# Patient Record
Sex: Female | Born: 1975 | Hispanic: No | Marital: Married | Smoking: Never smoker
Health system: Southern US, Community
[De-identification: ages and names within clinical notes are randomized; demographics above are authoritative.]

## PROBLEM LIST (undated history)

## (undated) ENCOUNTER — Emergency Department (HOSPITAL_COMMUNITY): Admission: EM | Payer: Self-pay

## (undated) DIAGNOSIS — F419 Anxiety disorder, unspecified: Secondary | ICD-10-CM

## (undated) DIAGNOSIS — J40 Bronchitis, not specified as acute or chronic: Secondary | ICD-10-CM

## (undated) DIAGNOSIS — D649 Anemia, unspecified: Secondary | ICD-10-CM

## (undated) DIAGNOSIS — I639 Cerebral infarction, unspecified: Secondary | ICD-10-CM

## (undated) DIAGNOSIS — I671 Cerebral aneurysm, nonruptured: Secondary | ICD-10-CM

## (undated) DIAGNOSIS — I8390 Asymptomatic varicose veins of unspecified lower extremity: Secondary | ICD-10-CM

## (undated) HISTORY — PX: CEREBRAL ANEURYSM REPAIR: SHX164

## (undated) HISTORY — PX: CYST EXCISION: SHX5701

## (undated) HISTORY — DX: Cerebral aneurysm, nonruptured: I67.1

---

## 2004-06-04 ENCOUNTER — Ambulatory Visit (HOSPITAL_COMMUNITY): Admission: RE | Admit: 2004-06-04 | Discharge: 2004-06-04 | Payer: Self-pay | Admitting: Pulmonary Disease

## 2004-06-12 ENCOUNTER — Ambulatory Visit (HOSPITAL_COMMUNITY): Admission: RE | Admit: 2004-06-12 | Discharge: 2004-06-12 | Payer: Self-pay | Admitting: Pulmonary Disease

## 2004-09-12 ENCOUNTER — Ambulatory Visit (HOSPITAL_COMMUNITY): Admission: RE | Admit: 2004-09-12 | Discharge: 2004-09-12 | Payer: Self-pay | Admitting: Pulmonary Disease

## 2005-03-03 ENCOUNTER — Ambulatory Visit (HOSPITAL_COMMUNITY): Admission: RE | Admit: 2005-03-03 | Discharge: 2005-03-03 | Payer: Self-pay | Admitting: Pulmonary Disease

## 2005-11-07 ENCOUNTER — Emergency Department (HOSPITAL_COMMUNITY): Admission: EM | Admit: 2005-11-07 | Discharge: 2005-11-07 | Payer: Self-pay | Admitting: Emergency Medicine

## 2005-11-10 ENCOUNTER — Emergency Department (HOSPITAL_COMMUNITY): Admission: EM | Admit: 2005-11-10 | Discharge: 2005-11-11 | Payer: Self-pay | Admitting: Emergency Medicine

## 2006-04-12 ENCOUNTER — Inpatient Hospital Stay (HOSPITAL_COMMUNITY): Admission: AD | Admit: 2006-04-12 | Discharge: 2006-04-12 | Payer: Self-pay | Admitting: Obstetrics & Gynecology

## 2006-10-06 ENCOUNTER — Ambulatory Visit (HOSPITAL_COMMUNITY): Admission: RE | Admit: 2006-10-06 | Discharge: 2006-10-06 | Payer: Self-pay | Admitting: Obstetrics & Gynecology

## 2006-10-19 ENCOUNTER — Inpatient Hospital Stay (HOSPITAL_COMMUNITY): Admission: AD | Admit: 2006-10-19 | Discharge: 2006-10-28 | Payer: Self-pay | Admitting: Obstetrics & Gynecology

## 2006-10-20 ENCOUNTER — Encounter: Payer: Self-pay | Admitting: Obstetrics & Gynecology

## 2006-10-26 ENCOUNTER — Encounter (INDEPENDENT_AMBULATORY_CARE_PROVIDER_SITE_OTHER): Payer: Self-pay | Admitting: Obstetrics

## 2006-10-30 ENCOUNTER — Encounter: Admission: RE | Admit: 2006-10-30 | Discharge: 2006-11-29 | Payer: Self-pay | Admitting: Obstetrics & Gynecology

## 2006-11-30 ENCOUNTER — Encounter: Admission: RE | Admit: 2006-11-30 | Discharge: 2006-12-29 | Payer: Self-pay | Admitting: Obstetrics & Gynecology

## 2006-12-30 ENCOUNTER — Encounter: Admission: RE | Admit: 2006-12-30 | Discharge: 2007-01-18 | Payer: Self-pay | Admitting: Obstetrics & Gynecology

## 2007-03-21 ENCOUNTER — Emergency Department (HOSPITAL_COMMUNITY): Admission: EM | Admit: 2007-03-21 | Discharge: 2007-03-21 | Payer: Self-pay | Admitting: Emergency Medicine

## 2007-05-22 ENCOUNTER — Inpatient Hospital Stay (HOSPITAL_COMMUNITY): Admission: AD | Admit: 2007-05-22 | Discharge: 2007-05-22 | Payer: Self-pay | Admitting: Family Medicine

## 2007-05-27 ENCOUNTER — Encounter: Admission: RE | Admit: 2007-05-27 | Discharge: 2007-05-27 | Payer: Self-pay | Admitting: Obstetrics

## 2007-08-19 ENCOUNTER — Ambulatory Visit (HOSPITAL_COMMUNITY): Admission: RE | Admit: 2007-08-19 | Discharge: 2007-08-19 | Payer: Self-pay | Admitting: *Deleted

## 2007-08-23 ENCOUNTER — Inpatient Hospital Stay (HOSPITAL_COMMUNITY): Admission: AD | Admit: 2007-08-23 | Discharge: 2007-08-23 | Payer: Self-pay | Admitting: Family Medicine

## 2007-10-16 ENCOUNTER — Inpatient Hospital Stay (HOSPITAL_COMMUNITY): Admission: AD | Admit: 2007-10-16 | Discharge: 2007-10-16 | Payer: Self-pay | Admitting: Family Medicine

## 2007-11-26 ENCOUNTER — Ambulatory Visit: Payer: Self-pay | Admitting: Obstetrics and Gynecology

## 2007-11-26 ENCOUNTER — Inpatient Hospital Stay (HOSPITAL_COMMUNITY): Admission: AD | Admit: 2007-11-26 | Discharge: 2007-11-29 | Payer: Self-pay | Admitting: Obstetrics & Gynecology

## 2008-07-20 ENCOUNTER — Emergency Department (HOSPITAL_COMMUNITY): Admission: EM | Admit: 2008-07-20 | Discharge: 2008-07-20 | Payer: Self-pay | Admitting: Emergency Medicine

## 2009-11-15 ENCOUNTER — Inpatient Hospital Stay (HOSPITAL_COMMUNITY): Admission: AD | Admit: 2009-11-15 | Discharge: 2009-11-17 | Payer: Self-pay | Admitting: Family Medicine

## 2010-04-07 ENCOUNTER — Encounter: Payer: Self-pay | Admitting: Obstetrics & Gynecology

## 2010-04-07 ENCOUNTER — Encounter: Payer: Self-pay | Admitting: Emergency Medicine

## 2010-04-07 ENCOUNTER — Encounter: Payer: Self-pay | Admitting: Pulmonary Disease

## 2010-05-30 LAB — CBC
HCT: 31.5 % — ABNORMAL LOW (ref 36.0–46.0)
HCT: 36 % (ref 36.0–46.0)
Hemoglobin: 10.6 g/dL — ABNORMAL LOW (ref 12.0–15.0)
MCH: 31.5 pg (ref 26.0–34.0)
MCH: 32 pg (ref 26.0–34.0)
Platelets: 188 10*3/uL (ref 150–400)
RBC: 3.33 MIL/uL — ABNORMAL LOW (ref 3.87–5.11)
RDW: 13.4 % (ref 11.5–15.5)

## 2010-05-30 LAB — URINALYSIS, DIPSTICK ONLY
Bilirubin Urine: NEGATIVE
Glucose, UA: NEGATIVE mg/dL
Hgb urine dipstick: NEGATIVE
Nitrite: NEGATIVE

## 2010-05-30 LAB — COMPREHENSIVE METABOLIC PANEL
AST: 24 U/L (ref 0–37)
Albumin: 2.7 g/dL — ABNORMAL LOW (ref 3.5–5.2)
CO2: 20 mEq/L (ref 19–32)
GFR calc Af Amer: >60 mL/min (ref >60)
Glucose, Bld: 91 mg/dL (ref 70–99)
Potassium: 3.9 mEq/L (ref 3.5–5.1)
Sodium: 132 mEq/L — ABNORMAL LOW (ref 135–145)
Total Bilirubin: 0.2 mg/dL — ABNORMAL LOW (ref 0.3–1.2)

## 2010-05-30 LAB — RPR: RPR Ser Ql: NONREACTIVE

## 2010-05-30 LAB — LACTATE DEHYDROGENASE: LDH: 140 U/L (ref 94–250)

## 2010-06-01 ENCOUNTER — Encounter (INDEPENDENT_AMBULATORY_CARE_PROVIDER_SITE_OTHER): Payer: Self-pay | Admitting: *Deleted

## 2010-06-01 LAB — CONVERTED CEMR LAB
HCT: 35.5 % — ABNORMAL LOW (ref 36.0–46.0)
MCV: 88.3 fL (ref 78.0–100.0)
RBC: 4.02 M/uL (ref 3.87–5.11)
WBC: 5.2 10*3/uL (ref 4.0–10.5)

## 2010-07-30 NOTE — Discharge Summary (Signed)
Holly [name], Holly [name]                 ACCOUNT NO.:  [id number]   MEDICAL RECORD NO.:  [id number]          PATIENT TYPE:  INP   LOCATION:  9128                          FACILITY:  [facility]   PHYSICIAN:  Roseanna Rainbow, M.D.DATE OF BIRTH:  September 12, 1975   DATE OF ADMISSION:  10/19/2006  DATE OF DISCHARGE:  10/28/2006                               DISCHARGE SUMMARY   CHIEF COMPLAINT:  The patient is a 35 year old gravida 1 with an  estimated date of confinement of August 13 who presents for induction of  labor secondary to pregnancy-induced hypertension.   HISTORY OF PRESENT ILLNESS:  Please see the above.   PAST SURGICAL HISTORY:  Cyst removed from a lower extremity.   PAST MEDICAL HISTORY:  She denies.   MEDICATIONS:  Prenatal vitamins.   ALLERGIES:  No known drug allergies.   SOCIAL HISTORY:  She is married.  She denies any tobacco, ethanol or  drug use.   PHYSICAL EXAM:  VITAL SIGNS:  Stable, afebrile.  LUNGS:  Clear to  auscultation bilaterally.  HEART:  Regular rate and rhythm.  ABDOMEN:  Gravid, nontender.  PELVIC:  The cervix is 2-cm dilated, 50% effaced  with the vertex at a -3 station.   ASSESSMENT:  Intrauterine pregnancy at 38 weeks 5 days with pregnancy-  induced hypertension.   PLAN:  Admission, two-stage induction of labor.   HOSPITAL COURSE:  The patient was admitted.  Cervidil was placed;  subsequent to that, she went into labor.  The membranes were  artificially ruptured at 9 cm and thin meconium was noted.  She  progressed to full dilatation; however, there was no further descent  beyond a 0 station.  At this point, the decision was made to proceed  with a cesarean delivery.  Please see the dictated operative summary for  further details.  On postoperative day #1, her hemoglobin was 9.9.  Her  blood pressures were stable.  The magnesium sulfate that had been  initiated in the immediate postop period was continued.  On August 8,  there was a 500+ fluid balance  on the night shift.  The nurse  appreciated fine crackles in the lung bases.  The patient was saturating  at 98%.  A chest x-ray was obtained that was consistent with pulmonary  congestion.  Lasix was administered to expedite the diuresis.  Her blood  pressures became labile and she was started on labetalol and Norvasc.  On August 9, Cardiology was consulted.  The impression was pulmonary  edema in the setting of pregnancy-induced hypertension, rule out a  cardiomyopathy.  A chest CT was obtained; there was no pulmonary  embolus; bilateral pleural effusions were noted, possible early  pneumonia.  TSH was normal.  IV albumin was administered as well.  The  pulmonary edema improved.  The diuretics were continued orally.  An ACE  inhibitor was added to her antihypertensive regimen.  On August 11,  there was a question of whether or not she was developing an  endometritis; however, she remained afebrile.  The 2-D echo was normal  with good left ventricular  function noted.  Zithromax was started  empirically for possible upper respiratory tract infection.  She was  discharged to home on August 13.   DISCHARGE DIAGNOSES:  1. Arrest of descent.  2. Postpartum severe preeclampia, pulmonary edema.   PROCEDURE:  Cesarean delivery.   CONDITION:  Stable.   DIET:  Regular.   ACTIVITIES:  Progressive activity.   MEDICATIONS:  Lasix, lisinopril, clonidine, labetalol and Norvasc.   DISPOSITION:  The patient was to follow up in the office in several  days.  She is to follow up with Cardiology in 2 weeks.      Roseanna Rainbow, M.D.  Electronically Signed     LAJ/MEDQ  D:  11/20/2006  T:  11/21/2006  Job:  [id number]

## 2010-07-30 NOTE — Op Note (Signed)
[name], [name]                 ACCOUNT NO.:  [id number]   MEDICAL RECORD NO.:  [id number]          PATIENT TYPE:  INP   LOCATION:  9105                          FACILITY:  [facility]   PHYSICIAN:  Allie Bossier, MD        DATE OF BIRTH:  May 27, 1975   DATE OF PROCEDURE:  DATE OF DISCHARGE:                               OPERATIVE REPORT   PREOPERATIVE DIAGNOSES:  1. Previous cesarean section and trial of labor after cesarean      section.  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  3. Meconium.  4. The patient exhaustion.   POSTOPERATIVE DIAGNOSES:  1. Previous cesarean section and trial of labor after cesarean      section.  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  3. Meconium.  4. The patient exhaustion.   PROCEDURES:  Vaginal birth after cesarean section with vacuum  assistance, second-degree midline episiotomy, repair of fourth-degree  laceration.   OBSTETRICIAN:  Myra C. Marice Potter, MD   ANESTHESIA:  Lidocaine.   COMPLICATIONS:  None.   ESTIMATED BLOOD LOSS:  300 mL.   SPECIMEN:  Cord blood.   FINDINGS:  1. Living female infant, Apgars 8 and 9 at 1 and 5 minutes, weight 7      pounds 14 ounces.  2. Intact placenta with meconium-stained membranes.  3. Three-vessel cord.   DETAILED PROCEDURE AND FINDINGS:  The patient had no epidural and then  pushed for over 2 hours.  She was complaining of exhaustion and  requested vacuum assistance.  I explained the risks and she wishes to  proceed.  Her bladder was emptied.  The baby was noted to be in an OA  position at the +3 station.  I applied the Kiwi vacuum with the pressure  in the green zone for 2 pulls (with 2 correct contractions).  On the  second one, the Kiwi popped off, but the baby had made good descensus  about this point, so I decided to forego any further vacuum.  I gave her  lidocaine in her perineum and cut an episiotomy due to her female  circumcision.  The baby was delivered in an OA position.  His mouth and  nostrils were suctioned prior to  delivery of shoulders.  Cord was  clamped and cut.  Baby was transferred to the newborn personnel for  routine care.  Cord blood samples obtained.  I then inspected the  perineum and vagina.  A fourth-degree laceration was noted.  A 3-0  Vicryl suture was used throughout the repair.  The sphincter muscles  were identified and grasped with Allis clamps.  They were then  reapproximated using 4 separate interrupted sutures.  The vaginal mucosa  was closed with a normal running-locking fashion.  The perineum and skin  were closed in a normal running-nonlocking suture.  At the completion of  the case, it was a negative rectovaginal exam.  The placenta was then  delivered and noted to be intact with meconium-stained membranes.  She  had normal lochia post delivery and tolerated the procedure well.  She  was given a total of approximately 20  mL of lidocaine during this repair  along with 2 mg of IV Stadol.      Allie Bossier, MD  Electronically Signed     MCD/MEDQ  D:  11/26/2007  T:  11/27/2007  Job:  [id number]

## 2010-07-30 NOTE — Op Note (Signed)
Holly [name], Holly [name]                 ACCOUNT NO.:  [id number]   MEDICAL RECORD NO.:  [id number]          PATIENT TYPE:  INP   LOCATION:  9374                          FACILITY:  [facility]   PHYSICIAN:  Roseanna Rainbow, M.D.DATE OF BIRTH:  Jun 12, 1975   DATE OF PROCEDURE:  10/20/2006  DATE OF DISCHARGE:                               OPERATIVE REPORT   PREOPERATIVE DIAGNOSES:  1. Intrauterine pregnancy at term, arrest of descent, active labor.  2. Pregnancy-induced hypertension.  3. Meconium staining.  4. Rule out large-for-gestational-age fetus.   POSTOPERATIVE DIAGNOSES:  1. Intrauterine pregnancy at term, arrest of descent, active labor.  2. Pregnancy-induced hypertension.  3. Meconium staining.  4. Rule out large-for-gestational-age fetus.   PROCEDURE:  Primary low uterine flap elliptical cesarean delivery via  Pfannenstiel skin incision.   SURGEON:  Roseanna Rainbow, M.D.   ANESTHESIA:  Epidural.   PATHOLOGY:  Placenta.   ESTIMATED BLOOD LOSS:  600 mL.   COMPLICATIONS:  None.   PROCEDURE:  The patient was taken to the operating room with an IV  running, an epidural catheter in situ and a Foley in place.  The patient  was placed in the dorsal supine position with a lateral tilt and prepped  and draped in the usual sterile fashion.  After a time-out, a  Pfannenstiel skin incision was then made with a scalpel and carried down  to the underlying fascia.  The fascia was nicked in the midline.  The  fascial incision was then extended bilaterally with the curved Mayo  scissors.  The superior aspect of the fascial incision was tented up and  the underlying rectus muscle was dissected off.  The inferior aspect of  the fascial incision was manipulated in a similar fashion.  The rectus  muscles were separated in the midline.  The parietal peritoneum was  tented up and entered sharply; this incision was then extended  superiorly and inferiorly with good visualization of the  bladder.  An  Alexis retractor was then placed into the incision.  The vesicouterine  peritoneum was tented up and entered sharply with Metzenbaum scissors;  this incision was then extended bilaterally and a bladder flap created  bluntly.  The lower uterine segment was then incised in a transverse  fashion with the scalpel; this incision was then extended bluntly.  The  infant's head was then delivered atraumatically.  The oropharynx was  suctioned with the bulb suction.  The cord was clamped and cut.  The  infant was handed off to the awaiting neonatologist.  An umbilical  artery pH was sent.  The Apgars were 9 and 10 at 1 and 5 minutes,  respectively.  The placenta was then removed,  The intrauterine cavity  was evacuated of any remaining amniotic fluid, clots and debris with a  moist laparotomy sponge.  The uterine incision was then reapproximated  in a running interlocking fashion using a suture of 0 Monocryl.  A  second imbricating suture of the same was placed for an imbricating  layer.  There was bleeding noted from the venous plexus near the  dome of  the bladder; these vessels were secured with suture ligatures of 2-0  Vicryl.  Adequate hemostasis was noted.  The paracolic gutters were then  copiously irrigated.  The retractor was then removed.  The parietal  peritoneum was reapproximated in a running fashion using 2-0 Vicryl.  The fascia was closed in a running fashion using 0 Vicryl.  The skin was  closed in a subcuticular fashion using 3-0 Monocryl.  Steri-Strips were  then applied.  At the close of the procedure, The instrument and pack  counts were said to be correct x2.  One gram of cephazolin had been  given at cord clamp.  The patient was taken to the PACU, awake and in  stable condition.      Roseanna Rainbow, M.D.  Electronically Signed     LAJ/MEDQ  D:  10/20/2006  T:  10/21/2006  Job:  [id number]

## 2010-12-05 LAB — POCT CARDIAC MARKERS
CKMB, poc: <1 — ABNORMAL LOW
Myoglobin, poc: 46.3

## 2010-12-05 LAB — HCG, QUANTITATIVE, PREGNANCY: hCG, Beta Chain, Quant, S: 205 — ABNORMAL HIGH

## 2010-12-05 LAB — I-STAT 8, (EC8 V) (CONVERTED LAB)
Bicarbonate: 25.4 — ABNORMAL HIGH
Glucose, Bld: 98
TCO2: 27
pCO2, Ven: 48.4
pH, Ven: 7.328 — ABNORMAL HIGH

## 2010-12-05 LAB — POCT PREGNANCY, URINE: Operator id: 116391

## 2010-12-09 LAB — URINALYSIS, ROUTINE W REFLEX MICROSCOPIC
Glucose, UA: NEGATIVE
Leukocytes, UA: NEGATIVE
Protein, ur: NEGATIVE
pH: 7.5

## 2010-12-09 LAB — WET PREP, GENITAL: Clue Cells Wet Prep HPF POC: NONE SEEN

## 2010-12-09 LAB — URINE MICROSCOPIC-ADD ON

## 2010-12-09 LAB — GC/CHLAMYDIA PROBE AMP, GENITAL: GC Probe Amp, Genital: NEGATIVE

## 2010-12-12 LAB — URINALYSIS, ROUTINE W REFLEX MICROSCOPIC
Hgb urine dipstick: NEGATIVE
Specific Gravity, Urine: 1.025
Urobilinogen, UA: 0.2

## 2010-12-18 LAB — COMPREHENSIVE METABOLIC PANEL
ALT: 12
AST: 27
Albumin: 2.7 — ABNORMAL LOW
Alkaline Phosphatase: 229 — ABNORMAL HIGH
Chloride: 103
GFR calc Af Amer: >60
Potassium: 4.7
Sodium: 131 — ABNORMAL LOW
Total Bilirubin: 0.8

## 2010-12-18 LAB — CBC
MCV: 92.9
RBC: 4.27
WBC: 5.9

## 2010-12-18 LAB — RPR: RPR Ser Ql: NONREACTIVE

## 2010-12-30 LAB — CBC
HCT: 28.2 — ABNORMAL LOW
HCT: 28.7 — ABNORMAL LOW
HCT: 34.5 — ABNORMAL LOW
HCT: 36.6
Hemoglobin: 12.2
Hemoglobin: 9.9 — ABNORMAL LOW
MCHC: 33.9
MCHC: 34.4
MCV: 88.3
MCV: 89
MCV: 89.1
Platelets: 221
Platelets: 233
Platelets: 291
RBC: 3.17 — ABNORMAL LOW
RBC: 3.19 — ABNORMAL LOW
RBC: 3.24 — ABNORMAL LOW
RDW: 13.7
RDW: 13.8
RDW: 14.1 — ABNORMAL HIGH
WBC: 11 — ABNORMAL HIGH
WBC: 8.6
WBC: 8.9
WBC: 9.5

## 2010-12-30 LAB — BASIC METABOLIC PANEL
BUN: 10
BUN: 7
CO2: 26
Calcium: 8.3 — ABNORMAL LOW
Chloride: 104
Creatinine, Ser: 0.65
Creatinine, Ser: 0.69
GFR calc Af Amer: >60
GFR calc non Af Amer: >60
Glucose, Bld: 116 — ABNORMAL HIGH
Potassium: 3.8
Sodium: 139

## 2010-12-30 LAB — CK TOTAL AND CKMB (NOT AT ARMC)
CK, MB: 6.9 — ABNORMAL HIGH
Relative Index: INVALID
Total CK: 122
Total CK: 88

## 2010-12-30 LAB — COMPREHENSIVE METABOLIC PANEL
ALT: 11
AST: 27
Albumin: 2.8 — ABNORMAL LOW
BUN: 5 — ABNORMAL LOW
BUN: 9
Calcium: 7.7 — ABNORMAL LOW
Calcium: 9.2
Creatinine, Ser: 0.58
GFR calc Af Amer: >60
GFR calc non Af Amer: >60
Glucose, Bld: 76
Sodium: 136
Total Protein: 5.1 — ABNORMAL LOW

## 2010-12-30 LAB — IRON AND TIBC
Iron: 86
UIBC: 255

## 2010-12-30 LAB — RPR: RPR Ser Ql: NONREACTIVE

## 2010-12-30 LAB — TROPONIN I
Troponin I: 0.12 — ABNORMAL HIGH
Troponin I: 0.21 — ABNORMAL HIGH

## 2010-12-30 LAB — T4, FREE: Free T4: 0.99

## 2010-12-30 LAB — TSH: TSH: 2.36

## 2010-12-30 LAB — URIC ACID: Uric Acid, Serum: 5.2

## 2010-12-30 LAB — B-NATRIURETIC PEPTIDE (CONVERTED LAB): Pro B Natriuretic peptide (BNP): 365 — ABNORMAL HIGH

## 2013-12-24 ENCOUNTER — Encounter (HOSPITAL_COMMUNITY): Payer: Self-pay | Admitting: Emergency Medicine

## 2013-12-24 ENCOUNTER — Emergency Department (HOSPITAL_COMMUNITY): Payer: Self-pay

## 2013-12-24 ENCOUNTER — Emergency Department (HOSPITAL_COMMUNITY)
Admission: EM | Admit: 2013-12-24 | Discharge: 2013-12-24 | Disposition: A | Discharge status: Home / Self Care | Payer: Self-pay | Attending: Emergency Medicine | Admitting: Emergency Medicine

## 2013-12-24 DIAGNOSIS — R079 Chest pain, unspecified: Secondary | ICD-10-CM | POA: Insufficient documentation

## 2013-12-24 DIAGNOSIS — Z79899 Other long term (current) drug therapy: Secondary | ICD-10-CM | POA: Insufficient documentation

## 2013-12-24 LAB — CBC
HCT: 31.6 % — ABNORMAL LOW (ref 36.0–46.0)
Hemoglobin: 10 g/dL — ABNORMAL LOW (ref 12.0–15.0)
MCH: 24.8 pg — ABNORMAL LOW (ref 26.0–34.0)
MCHC: 31.6 g/dL (ref 30.0–36.0)
MCV: 78.4 fL (ref 78.0–100.0)
PLATELETS: 280 10*3/uL (ref 150–400)
RBC: 4.03 MIL/uL (ref 3.87–5.11)
RDW: 15.3 % (ref 11.5–15.5)
WBC: 3.4 10*3/uL — AB (ref 4.0–10.5)

## 2013-12-24 LAB — I-STAT TROPONIN, ED: Troponin i, poc: 0 ng/mL (ref 0.00–0.08)

## 2013-12-24 LAB — PRO B NATRIURETIC PEPTIDE: Pro B Natriuretic peptide (BNP): 41.4 pg/mL (ref 0–125)

## 2013-12-24 LAB — BASIC METABOLIC PANEL
ANION GAP: 13 (ref 5–15)
BUN: 11 mg/dL (ref 6–23)
CHLORIDE: 101 meq/L (ref 96–112)
CO2: 23 meq/L (ref 19–32)
Calcium: 9.2 mg/dL (ref 8.4–10.5)
Creatinine, Ser: 0.59 mg/dL (ref 0.50–1.10)
GFR calc non Af Amer: >90 mL/min (ref >90)
Glucose, Bld: 86 mg/dL (ref 70–99)
POTASSIUM: 4 meq/L (ref 3.7–5.3)
Sodium: 137 mEq/L (ref 137–147)

## 2013-12-24 NOTE — Discharge Instructions (Signed)

## 2013-12-24 NOTE — ED Provider Notes (Signed)
CSN: [id number]     Arrival date & time 12/24/13  1150 History   First MD Initiated Contact with Patient 12/24/13 1441     Chief Complaint  Patient presents with  . Chest Pain   Patient is a 38 y.o. female presenting with chest pain. The history is provided by the patient.  Chest Pain Pain location:  Substernal area Pain radiates to:  L arm Pain radiates to the back: yes   Duration:  4 days Timing:  Intermittent Progression:  Waxing and waning Chronicity:  Recurrent Worsened by:  Nothing tried Ineffective treatments:  None tried Associated symptoms: no abdominal pain, no back pain, no diaphoresis, no dizziness, no fatigue, no fever, no headache, no lower extremity edema, no nausea, no palpitations, no shortness of breath and not vomiting    38 year old African female with no significant past history presents with chest pain. Onset was 4 days ago and is intermittent. There are no aggravating or alleviating factors. The patient denies shortness of breath. She states the pain occasionally radiates to her back and into her left arm. Patient is currently chest pain-free. Patient denies history of hypertension, diabetes, hyperlipidemia and, tobacco abuse or family history of cardiac disease.  History reviewed. No pertinent past medical history. History reviewed. No pertinent past surgical history. History reviewed. No pertinent family history. History  Substance Use Topics  . Smoking status: Never Smoker   . Smokeless tobacco: Not on file  . Alcohol Use: No   OB History   Grav Para Term Preterm Abortions TAB SAB Ect Mult Living                 Review of Systems  Constitutional: Negative for fever, diaphoresis and fatigue.  HENT: Negative for rhinorrhea and sore throat.   Eyes: Negative for visual disturbance.  Respiratory: Negative for chest tightness and shortness of breath.   Cardiovascular: Positive for chest pain. Negative for palpitations.  Gastrointestinal: Negative for  nausea, vomiting, abdominal pain and constipation.  Genitourinary: Negative for dysuria and hematuria.  Musculoskeletal: Negative for back pain and neck pain.  Skin: Negative for rash.  Neurological: Negative for dizziness and headaches.  Psychiatric/Behavioral: Negative for confusion.  All other systems reviewed and are negative.  Allergies  Mango flavor  Home Medications   Prior to Admission medications   Medication Sig Start Date End Date Taking? Authorizing Provider  acetaminophen (TYLENOL) 325 MG tablet Take 650 mg by mouth 2 (two) times daily as needed (pain).   Yes Historical Provider, MD  Iron TABS Take 1 tablet by mouth daily.   Yes Historical Provider, MD  Polyethyl Glycol-Propyl Glycol (SYSTANE OP) Place 1 drop into both eyes 2 (two) times daily as needed (dry eyes).   Yes Historical Provider, MD   BP 116/78  Pulse 72  Temp(Src) 98.6 F (37 C) (Oral)  Resp 18  Ht 5\' 8"  (1.727 m)  Wt 170 lb (77.111 kg)  BMI 25.85 kg/m2  SpO2 99%  LMP 12/22/2013 Physical Exam  Constitutional: She is oriented to person, place, and time. She appears well-developed and well-nourished. No distress.  HENT:  Head: Normocephalic and atraumatic.  Mouth/Throat: Oropharynx is clear and moist.  Eyes: EOM are normal. Pupils are equal, round, and reactive to light.  Neck: Neck supple. No JVD present.  Cardiovascular: Normal rate, regular rhythm, normal heart sounds and intact distal pulses.  Exam reveals no gallop.   No murmur heard. Pulmonary/Chest: Effort normal and breath sounds normal. She has no wheezes. She  has no rales.  Abdominal: Soft. She exhibits no distension. There is no tenderness.  Musculoskeletal: Normal range of motion. She exhibits no tenderness.  Neurological: She is alert and oriented to person, place, and time. No cranial nerve deficit. She exhibits normal muscle tone.  Skin: Skin is warm and dry. No rash noted.  Psychiatric: Her behavior is normal.    ED Course   Procedures  None  Labs Review Labs Reviewed  CBC - Abnormal; Notable for the following:    WBC 3.4 (*)    Hemoglobin 10.0 (*)    HCT 31.6 (*)    MCH 24.8 (*)    All other components within normal limits  BASIC METABOLIC PANEL  PRO B NATRIURETIC PEPTIDE  I-STAT TROPOININ, ED    Imaging Review Dg Chest 2 View  12/24/2013   CLINICAL DATA:  Chest pain  EXAM: CHEST  2 VIEW  COMPARISON:  10/24/2006  FINDINGS: Cardiomediastinal silhouette is stable. Mild thoracic dextroscoliosis. No acute infiltrate or pleural effusion. No pulmonary edema.  IMPRESSION: No active cardiopulmonary disease.  Mild thoracic dextroscoliosis.   Electronically Signed   By: Lahoma Crocker M.D.   On: 12/24/2013 13:43     EKG Interpretation   Date/Time:  Saturday December 24 2013 11:57:10 EDT Ventricular Rate:  77 PR Interval:  176 QRS Duration: 82 QT Interval:  354 QTC Calculation: 400 R Axis:   45 Text Interpretation:  Normal sinus rhythm Possible Left atrial enlargement  Borderline ECG Confirmed by BEATON  MD, ROBERT ([id number]) on 12/24/2013  4:12:21 PM      MDM   Final diagnoses:  Chest pain, unspecified chest pain type    38 year old African female presents with chest pain. She is well appearing on exam. Afebrile, VSS. Troponin negative. EKG are reassuring. Chest x-ray unremarkable. Remainder of labs unremarkable. EKG demonstrates NSR, rate 77, TWI in aVR and V1, QTc 400, no ST elevation or depression, no ectopy. No comparison EKG. Patient desires to have outpatient stress test as opposed to coming in and observation. Feel this is reasonable. Cardiology consult. Spoke with Dr. Doylene Canard who agreed to perform outpatient stress test in 2 days. Patient stable for discharge.  Case discussed with Dr. Audie Pinto.   Gustavus Bryant, MD 12/24/13 [phone]

## 2013-12-24 NOTE — ED Provider Notes (Signed)
I saw and evaluated the patient, reviewed the resident's note and I agree with the findings and plan.   .Face to face Exam:  General:  Awake HEENT:  Atraumatic Resp:  Normal effort Abd:  Nondistended Neuro:No focal weakness Lymph: No adenopathy  EKG was discussed and reviewed with resident  Dot Lanes, MD 12/24/13 2125

## 2013-12-24 NOTE — ED Notes (Signed)
Pt reports that she has had midsternal chest pain for the past 4 days. Reports some SOB, but no n/v with the pain. Tried tylenol at home with no relief.

## 2014-06-04 ENCOUNTER — Inpatient Hospital Stay (HOSPITAL_COMMUNITY)
Admission: AD | Admit: 2014-06-04 | Discharge: 2014-06-05 | Disposition: A | Discharge status: Home / Self Care | Payer: Self-pay | Source: Ambulatory Visit | Attending: Obstetrics and Gynecology | Admitting: Obstetrics and Gynecology

## 2014-06-04 DIAGNOSIS — R102 Pelvic and perineal pain: Secondary | ICD-10-CM | POA: Insufficient documentation

## 2014-06-04 DIAGNOSIS — N898 Other specified noninflammatory disorders of vagina: Secondary | ICD-10-CM | POA: Insufficient documentation

## 2014-06-04 LAB — POCT PREGNANCY, URINE: PREG TEST UR: NEGATIVE

## 2014-06-04 NOTE — MAU Note (Signed)
Lower abdominal pain x 4 days, constant. Denies vaginal bleeding. Vaginal discharge, white& thick; x 4 days; denies vaginal irritation. LMP 05/19/14

## 2014-06-05 ENCOUNTER — Encounter (HOSPITAL_COMMUNITY): Payer: Self-pay | Admitting: *Deleted

## 2014-06-05 DIAGNOSIS — N949 Unspecified condition associated with female genital organs and menstrual cycle: Secondary | ICD-10-CM

## 2014-06-05 LAB — URINALYSIS, ROUTINE W REFLEX MICROSCOPIC
BILIRUBIN URINE: NEGATIVE
Glucose, UA: NEGATIVE mg/dL
Ketones, ur: 15 mg/dL — AB
Leukocytes, UA: NEGATIVE
NITRITE: NEGATIVE
PH: 6 (ref 5.0–8.0)
Protein, ur: NEGATIVE mg/dL
SPECIFIC GRAVITY, URINE: 1.025 (ref 1.005–1.030)
UROBILINOGEN UA: 0.2 mg/dL (ref 0.0–1.0)

## 2014-06-05 LAB — WET PREP, GENITAL
Clue Cells Wet Prep HPF POC: NONE SEEN
Trich, Wet Prep: NONE SEEN
Yeast Wet Prep HPF POC: NONE SEEN

## 2014-06-05 LAB — URINE MICROSCOPIC-ADD ON

## 2014-06-05 LAB — GC/CHLAMYDIA PROBE AMP (~~LOC~~) NOT AT ARMC
Chlamydia: NEGATIVE
Neisseria Gonorrhea: NEGATIVE

## 2014-06-05 LAB — HIV ANTIBODY (ROUTINE TESTING W REFLEX): HIV Screen 4th Generation wRfx: NONREACTIVE

## 2014-06-05 MED ORDER — IBUPROFEN 600 MG PO TABS: 600.0000 mg | ORAL_TABLET | Freq: Four times a day (QID) | ORAL | Status: DC | PRN

## 2014-06-05 MED ORDER — IBUPROFEN 600 MG PO TABS
600.0000 mg | ORAL_TABLET | Freq: Once | ORAL | Status: AC
  Administered 2014-06-05: 600 mg via ORAL
  Filled 2014-06-05: qty 1

## 2014-06-05 NOTE — MAU Provider Note (Signed)
History     CSN: [id number]  Arrival date and time: 06/04/14 2247   First Provider Initiated Contact with Patient 06/05/14 0134      Chief Complaint  Patient presents with  . Abdominal Pain   HPI  [name] is a 39 y.o. [id number] here with report of lower bilateral abdominal pain x 4 days.  Pain is described as constant and often radiates to back. Pt states that she is also having vaginal discharge that is clear with some pinksih spotting yesterday. No report of vaginal itching or odor.  Pt denies nausea vomiting and diarrhea.  Patient's last menstrual period was 05/19/2014.  No current form of birth control.  History reviewed. No pertinent past medical history.  Past Surgical History  Procedure Laterality Date  . Cyst excision    . Cesarean section      History reviewed. No pertinent family history.  History  Substance Use Topics  . Smoking status: Never Smoker   . Smokeless tobacco: Not on file  . Alcohol Use: No    Allergies:  Allergies  Allergen Reactions  . Mango Flavor Itching    Prescriptions prior to admission  Medication Sig Dispense Refill Last Dose  . acetaminophen (TYLENOL) 325 MG tablet Take 650 mg by mouth 2 (two) times daily as needed (pain).   12/22/2013 at Unknown time  . Iron TABS Take 1 tablet by mouth daily.   12/23/2013 at Unknown time  . Polyethyl Glycol-Propyl Glycol (SYSTANE OP) Place 1 drop into both eyes 2 (two) times daily as needed (dry eyes).   12/23/2013 at Unknown time    Review of Systems  Constitutional: Negative for fever and chills.  Gastrointestinal: Positive for abdominal pain. Negative for nausea, vomiting, diarrhea and constipation.  Genitourinary: Negative for dysuria, urgency and frequency.       Pink vaginal discharge  Musculoskeletal: Positive for back pain.  Neurological: Negative for dizziness and headaches.  All other systems reviewed and are negative.  Physical Exam   Blood pressure 138/84, pulse 76,  temperature 99.4 F (37.4 C), temperature source Oral, resp. rate 16, height 5\' 5"  (1.651 m), weight 81.92 kg (180 lb 9.6 oz), last menstrual period 05/19/2014, SpO2 100 %.  Physical Exam  Constitutional: She is oriented to person, place, and time. She appears well-developed and well-nourished. No distress.  HENT:  Head: Normocephalic.  Neck: Normal range of motion. Neck supple.  Cardiovascular: Normal rate, regular rhythm and normal heart sounds.   Respiratory: Effort normal and breath sounds normal. No respiratory distress.  GI: Soft. There is no tenderness.  Genitourinary: Cervix exhibits discharge (clear egg-white appearing discharge). Right adnexum displays no mass and no tenderness. Left adnexum displays no mass and no tenderness. No bleeding in the vagina. No vaginal discharge found.  Musculoskeletal: Normal range of motion. She exhibits no edema.  Neurological: She is alert and oriented to person, place, and time. She has normal reflexes.  Skin: Skin is warm and dry.    MAU Course  Procedures Results for orders placed or performed during the hospital encounter of 06/04/14 (from the past 24 hour(s))  Urinalysis, Routine w reflex microscopic     Status: Abnormal   Collection Time: 06/04/14 11:28 PM  Result Value Ref Range   Color, Urine YELLOW YELLOW   APPearance CLEAR CLEAR   Specific Gravity, Urine 1.025 1.005 - 1.030   pH 6.0 5.0 - 8.0   Glucose, UA NEGATIVE NEGATIVE mg/dL   Hgb urine dipstick SMALL (A)  NEGATIVE   Bilirubin Urine NEGATIVE NEGATIVE   Ketones, ur 15 (A) NEGATIVE mg/dL   Protein, ur NEGATIVE NEGATIVE mg/dL   Urobilinogen, UA 0.2 0.0 - 1.0 mg/dL   Nitrite NEGATIVE NEGATIVE   Leukocytes, UA NEGATIVE NEGATIVE  Urine microscopic-add on     Status: Abnormal   Collection Time: 06/04/14 11:28 PM  Result Value Ref Range   Squamous Epithelial / LPF RARE RARE   WBC, UA 0-2 <3 WBC/hpf   RBC / HPF 3-6 <3 RBC/hpf   Bacteria, UA FEW (A) RARE  Pregnancy, urine POC      Status: None   Collection Time: 06/04/14 11:41 PM  Result Value Ref Range   Preg Test, Ur NEGATIVE NEGATIVE  Wet prep, genital     Status: Abnormal   Collection Time: 06/05/14  2:15 AM  Result Value Ref Range   Yeast Wet Prep HPF POC NONE SEEN NONE SEEN   Trich, Wet Prep NONE SEEN NONE SEEN   Clue Cells Wet Prep HPF POC NONE SEEN NONE SEEN   WBC, Wet Prep HPF POC FEW (A) NONE SEEN   Ibuprofen 600 mg PO  Assessment and Plan  Acute lower pelvic pain   Plan: GC/CT and HIV pending Outpatient ultrasound per patient request Discussed the possibility of ovulation related pain  Holly Gray, CNM

## 2014-06-05 NOTE — MAU Note (Signed)
Pt. having lower abdominal pain x 4 days that is not going away. Pt states that pain is constant and often radiates to back. Pt states that she is also having discharge that is clear with some pinksih spotting yesterday. Pt denies nausea vomiting and diarrhea.

## 2014-06-07 ENCOUNTER — Encounter (HOSPITAL_COMMUNITY): Payer: Self-pay | Admitting: *Deleted

## 2014-06-07 ENCOUNTER — Inpatient Hospital Stay (HOSPITAL_COMMUNITY)
Admission: AD | Admit: 2014-06-07 | Discharge: 2014-06-07 | Disposition: A | Discharge status: Home / Self Care | Payer: Self-pay | Source: Ambulatory Visit | Attending: Obstetrics & Gynecology | Admitting: Obstetrics & Gynecology

## 2014-06-07 ENCOUNTER — Ambulatory Visit (HOSPITAL_COMMUNITY)
Admission: RE | Admit: 2014-06-07 | Discharge: 2014-06-07 | Disposition: A | Discharge status: Home / Self Care | Payer: Self-pay | Source: Ambulatory Visit | Attending: Family | Admitting: Family

## 2014-06-07 DIAGNOSIS — D251 Intramural leiomyoma of uterus: Secondary | ICD-10-CM

## 2014-06-07 DIAGNOSIS — N83201 Unspecified ovarian cyst, right side: Secondary | ICD-10-CM

## 2014-06-07 DIAGNOSIS — R102 Pelvic and perineal pain: Secondary | ICD-10-CM

## 2014-06-07 DIAGNOSIS — R51 Headache: Secondary | ICD-10-CM | POA: Insufficient documentation

## 2014-06-07 DIAGNOSIS — R1031 Right lower quadrant pain: Secondary | ICD-10-CM

## 2014-06-07 DIAGNOSIS — N8320 Unspecified ovarian cysts: Secondary | ICD-10-CM | POA: Insufficient documentation

## 2014-06-07 DIAGNOSIS — D259 Leiomyoma of uterus, unspecified: Secondary | ICD-10-CM | POA: Insufficient documentation

## 2014-06-07 DIAGNOSIS — R112 Nausea with vomiting, unspecified: Secondary | ICD-10-CM | POA: Insufficient documentation

## 2014-06-07 MED ORDER — ACETAMINOPHEN-CODEINE #3 300-30 MG PO TABS: 1.0000 | ORAL_TABLET | Freq: Four times a day (QID) | ORAL | Status: DC | PRN

## 2014-06-07 NOTE — MAU Note (Signed)
Urine in lab 

## 2014-06-07 NOTE — MAU Note (Signed)
Pt has outpt [place] scheduled at 1315.  Called [place], as pt needs [place].  Pt taken to [place], they will bring her back when she is done.

## 2014-06-07 NOTE — MAU Note (Signed)
Was here last Sun night, had pain in pelvis.  Tests were neg.  Pain has continued, gotten worse.  Last night had a fever. (100.4) no appetite. No vomiting, no diarrhea.  Constant headache.

## 2014-06-07 NOTE — MAU Provider Note (Signed)
History     CSN: [id number]  Arrival date and time: 06/07/14 1240   First Provider Initiated Contact with Patient 06/07/14 1627      No chief complaint on file.  HPI  Holly Gray is a 39 y.o. [id number], not currently pregnant with no significant medical history presenting with lower pelvic pain for the past 3 years. The pain is dull and constant but can be sharp and 10/10 pain at times. Current pain is 1/10. Pt says pain often radiates to her back. She takes ibuprofen that temporarily relieves the pain. She notes some light pink vaginal spotting that has occurred for the past few days. She also had some brown and clear discharge 3 days ago. She reports a fever of 104 yesterday that was relieved after taking a nap and waking up diaphoretic. She has been feeling nauseous for the past 3 days and has vomited twice with last episode this morning. She has also had a mild, 3/10, temporal headache since yesterday. Denies photophobia or phonophobia. Pain relieved with ibuprofen. She reports feeling "fine" now. LMP 05/19/14. Menstruation has been occuring regularly but has been heavier for the past few years. She is sexually active but not currently using contraception.   She was seen here in MAU a few days ago with the same pain and the provider ordered her an outpatient [place] for today. She signed into MAU because she has continued to have pain.   OB History    Gravida Para Term Preterm AB TAB SAB Ectopic Multiple Living   3 3 3       3       Past Medical History  Diagnosis Date  . Medical history non-contributory     Past Surgical History  Procedure Laterality Date  . Cyst excision    . Cesarean section      History reviewed. No pertinent family history.  History  Substance Use Topics  . Smoking status: Never Smoker   . Smokeless tobacco: Not on file  . Alcohol Use: No    Allergies:  Allergies  Allergen Reactions  . Mango Flavor Itching    Prescriptions prior to admission   Medication Sig Dispense Refill Last Dose  . acetaminophen (TYLENOL) 325 MG tablet Take 650 mg by mouth 2 (two) times daily as needed (pain).   Past Week at Unknown time  . ibuprofen (ADVIL,MOTRIN) 200 MG tablet Take 400 mg by mouth every 6 (six) hours as needed for moderate pain.   06/07/2014 at Unknown time  . Polyethyl Glycol-Propyl Glycol (SYSTANE OP) Place 1 drop into both eyes 2 (two) times daily as needed (dry eyes).   Past Week at Unknown time  . ibuprofen (ADVIL,MOTRIN) 600 MG tablet Take 1 tablet (600 mg total) by mouth every 6 (six) hours as needed. (Patient not taking: Reported on 06/07/2014) 30 tablet 0    No results found for this or any previous visit (from the past 48 hour(s)).   US Transvaginal Non-ob  06/07/2014   CLINICAL DATA:  Acute pelvic pain, acute, beginning 5 days ago.  EXAM: TRANSABDOMINAL AND TRANSVAGINAL ULTRASOUND OF PELVIS  TECHNIQUE: Both transabdominal and transvaginal ultrasound examinations of the pelvis were performed. Transabdominal technique was performed for global imaging of the pelvis including uterus, ovaries, adnexal regions, and pelvic cul-de-sac. It was necessary to proceed with endovaginal exam following the transabdominal exam to visualize the endometrium and ovaries.  COMPARISON:  None  FINDINGS: Uterus  Measurements: 10.3 x 6.1 x 8.3 cm. Heterogeneous echotexture  throughout the uterus. Small hypoechoic fundal fibroids, the largest a posterior fundal intramural fibroid measuring up to 1.5 cm.  Endometrium  Thickness: 11 mm in thickness.  No focal abnormality visualized.  Right ovary  Measurements: 3.3 x 2.1 x 3.0 cm. 2.2 cm complex cystic area, likely corpus luteum cyst.  Left ovary  Measurements: 2.6 x 1.2 x 2.3 cm. Normal appearance/no adnexal mass.  Other findings  Trace free fluid in the pelvis.  IMPRESSION: Small uterine fibroids as above.  Complex cystic area within the right ovary measuring 2.2 cm, likely complex corpus luteum cyst.   Electronically  Signed   By: Rolm Baptise M.D.   On: 06/07/2014 14:18   US Pelvis Complete  06/07/2014   CLINICAL DATA:  Acute pelvic pain, acute, beginning 5 days ago.  EXAM: TRANSABDOMINAL AND TRANSVAGINAL ULTRASOUND OF PELVIS  TECHNIQUE: Both transabdominal and transvaginal ultrasound examinations of the pelvis were performed. Transabdominal technique was performed for global imaging of the pelvis including uterus, ovaries, adnexal regions, and pelvic cul-de-sac. It was necessary to proceed with endovaginal exam following the transabdominal exam to visualize the endometrium and ovaries.  COMPARISON:  None  FINDINGS: Uterus  Measurements: 10.3 x 6.1 x 8.3 cm. Heterogeneous echotexture throughout the uterus. Small hypoechoic fundal fibroids, the largest a posterior fundal intramural fibroid measuring up to 1.5 cm.  Endometrium  Thickness: 11 mm in thickness.  No focal abnormality visualized.  Right ovary  Measurements: 3.3 x 2.1 x 3.0 cm. 2.2 cm complex cystic area, likely corpus luteum cyst.  Left ovary  Measurements: 2.6 x 1.2 x 2.3 cm. Normal appearance/no adnexal mass.  Other findings  Trace free fluid in the pelvis.  IMPRESSION: Small uterine fibroids as above.  Complex cystic area within the right ovary measuring 2.2 cm, likely complex corpus luteum cyst.   Electronically Signed   By: Rolm Baptise M.D.   On: 06/07/2014 14:18     Review of Systems  Constitutional: Negative for fever, chills, weight loss, malaise/fatigue and diaphoresis.  HENT: Negative for congestion, nosebleeds and sore throat.   Eyes: Negative.   Respiratory: Negative for cough, shortness of breath, wheezing and stridor.   Cardiovascular: Negative for chest pain and palpitations.  Gastrointestinal: Positive for nausea and vomiting. Negative for heartburn, abdominal pain, diarrhea, constipation and blood in stool.  Genitourinary: Negative for dysuria, urgency, frequency and hematuria.  Musculoskeletal:       Pelvic pain radiates to back at  times  Neurological: Positive for headaches. Negative for dizziness, tingling, sensory change, focal weakness, loss of consciousness and weakness.  Endo/Heme/Allergies: Negative.   Psychiatric/Behavioral: Negative.    Physical Exam   Blood pressure 114/72, pulse 90, temperature 99.7 F (37.6 C), temperature source Oral, resp. rate 16, weight 81.194 kg (179 lb), last menstrual period 05/19/2014.  Physical Exam  Constitutional: She is oriented to person, place, and time. She appears well-developed and well-nourished. No distress.  HENT:  Head: Normocephalic and atraumatic.  Eyes: Conjunctivae and EOM are normal. Pupils are equal, round, and reactive to light.  Neck: Normal range of motion. Neck supple.  Cardiovascular: Normal rate, regular rhythm, normal heart sounds and intact distal pulses.   Respiratory: Effort normal and breath sounds normal. No stridor.  GI: Soft. Bowel sounds are normal. She exhibits no distension and no mass. There is no tenderness. There is no rebound and no guarding.  Musculoskeletal: Normal range of motion. She exhibits no edema or tenderness.  Lymphadenopathy:    She has no cervical adenopathy.  Neurological: She is alert and oriented to person, place, and time. No cranial nerve deficit.  Skin: Skin is warm and dry. She is not diaphoretic.  Psychiatric: She has a normal mood and affect. Her behavior is normal.    MAU Course  Procedures  None  MDM  Transvaginal/Pelvic US reveals small, fundal uterine fibroids and a cyst on right ovary   Assessment and Plan   A:  Right ovarian cyst 2.2 cm likely corpus luteum  Intramural fibroid; largest 1.5 cm   P:  Discharge home in stable condition RX: tylenol #3 Ok to take ibuprofen as directed on the bottle Return to MAU if symptoms worsen   Lezlie Lye, NP 06/07/2014 7:05 PM

## 2014-09-25 ENCOUNTER — Emergency Department (HOSPITAL_COMMUNITY): Payer: Self-pay

## 2014-09-25 ENCOUNTER — Encounter (HOSPITAL_COMMUNITY): Payer: Self-pay | Admitting: Nurse Practitioner

## 2014-09-25 ENCOUNTER — Emergency Department (HOSPITAL_COMMUNITY)
Admission: EM | Admit: 2014-09-25 | Discharge: 2014-09-25 | Disposition: A | Discharge status: Home / Self Care | Payer: Self-pay | Attending: Emergency Medicine | Admitting: Emergency Medicine

## 2014-09-25 DIAGNOSIS — I639 Cerebral infarction, unspecified: Secondary | ICD-10-CM

## 2014-09-25 DIAGNOSIS — D649 Anemia, unspecified: Secondary | ICD-10-CM | POA: Insufficient documentation

## 2014-09-25 DIAGNOSIS — M6281 Muscle weakness (generalized): Secondary | ICD-10-CM | POA: Insufficient documentation

## 2014-09-25 DIAGNOSIS — Z79899 Other long term (current) drug therapy: Secondary | ICD-10-CM | POA: Insufficient documentation

## 2014-09-25 DIAGNOSIS — M6289 Other specified disorders of muscle: Secondary | ICD-10-CM

## 2014-09-25 DIAGNOSIS — R29898 Other symptoms and signs involving the musculoskeletal system: Secondary | ICD-10-CM

## 2014-09-25 DIAGNOSIS — R112 Nausea with vomiting, unspecified: Secondary | ICD-10-CM | POA: Insufficient documentation

## 2014-09-25 DIAGNOSIS — R531 Weakness: Secondary | ICD-10-CM

## 2014-09-25 DIAGNOSIS — R200 Anesthesia of skin: Secondary | ICD-10-CM | POA: Insufficient documentation

## 2014-09-25 DIAGNOSIS — R079 Chest pain, unspecified: Secondary | ICD-10-CM | POA: Insufficient documentation

## 2014-09-25 LAB — I-STAT CHEM 8, ED
BUN: 13 mg/dL (ref 6–20)
CALCIUM ION: 1.12 mmol/L (ref 1.12–1.23)
CREATININE: 0.5 mg/dL (ref 0.44–1.00)
Chloride: 104 mmol/L (ref 101–111)
Glucose, Bld: 94 mg/dL (ref 65–99)
HEMATOCRIT: 32 % — AB (ref 36.0–46.0)
HEMOGLOBIN: 10.9 g/dL — AB (ref 12.0–15.0)
Potassium: 3.5 mmol/L (ref 3.5–5.1)
Sodium: 138 mmol/L (ref 135–145)
TCO2: 19 mmol/L (ref 0–100)

## 2014-09-25 LAB — DIFFERENTIAL
Basophils Absolute: 0 10*3/uL (ref 0.0–0.1)
Basophils Relative: 1 % (ref 0–1)
EOS ABS: 0.1 10*3/uL (ref 0.0–0.7)
Eosinophils Relative: 3 % (ref 0–5)
LYMPHS ABS: 2 10*3/uL (ref 0.7–4.0)
Lymphocytes Relative: 51 % — ABNORMAL HIGH (ref 12–46)
MONO ABS: 0.4 10*3/uL (ref 0.1–1.0)
Monocytes Relative: 10 % (ref 3–12)
NEUTROS ABS: 1.4 10*3/uL — AB (ref 1.7–7.7)
Neutrophils Relative %: 35 % — ABNORMAL LOW (ref 43–77)

## 2014-09-25 LAB — I-STAT TROPONIN, ED: TROPONIN I, POC: 0 ng/mL (ref 0.00–0.08)

## 2014-09-25 LAB — APTT: APTT: 27 s (ref 24–37)

## 2014-09-25 LAB — COMPREHENSIVE METABOLIC PANEL
ALBUMIN: 3.5 g/dL (ref 3.5–5.0)
ALK PHOS: 51 U/L (ref 38–126)
ALT: 26 U/L (ref 14–54)
AST: 27 U/L (ref 15–41)
Anion gap: 9 (ref 5–15)
BUN: 12 mg/dL (ref 6–20)
CALCIUM: 8.9 mg/dL (ref 8.9–10.3)
CO2: 22 mmol/L (ref 22–32)
Chloride: 104 mmol/L (ref 101–111)
Creatinine, Ser: 0.46 mg/dL (ref 0.44–1.00)
GFR calc Af Amer: >60 mL/min (ref >60)
GFR calc non Af Amer: >60 mL/min (ref >60)
GLUCOSE: 95 mg/dL (ref 65–99)
Potassium: 3.5 mmol/L (ref 3.5–5.1)
Sodium: 135 mmol/L (ref 135–145)
Total Bilirubin: 0.3 mg/dL (ref 0.3–1.2)
Total Protein: 6.8 g/dL (ref 6.5–8.1)

## 2014-09-25 LAB — CBC
HCT: 29.1 % — ABNORMAL LOW (ref 36.0–46.0)
Hemoglobin: 9.1 g/dL — ABNORMAL LOW (ref 12.0–15.0)
MCH: 23.4 pg — ABNORMAL LOW (ref 26.0–34.0)
MCHC: 31.3 g/dL (ref 30.0–36.0)
MCV: 74.8 fL — AB (ref 78.0–100.0)
PLATELETS: 301 10*3/uL (ref 150–400)
RBC: 3.89 MIL/uL (ref 3.87–5.11)
RDW: 16 % — ABNORMAL HIGH (ref 11.5–15.5)
WBC: 3.9 10*3/uL — AB (ref 4.0–10.5)

## 2014-09-25 LAB — PROTIME-INR
INR: 1.15 (ref 0.00–1.49)
PROTHROMBIN TIME: 14.9 s (ref 11.6–15.2)

## 2014-09-25 LAB — CBG MONITORING, ED: Glucose-Capillary: 82 mg/dL (ref 65–99)

## 2014-09-25 MED ORDER — HYDROMORPHONE HCL 1 MG/ML IJ SOLN
1.0000 mg | Freq: Once | INTRAMUSCULAR | Status: DC
  Filled 2014-09-25: qty 1

## 2014-09-25 MED ORDER — MORPHINE SULFATE 4 MG/ML IJ SOLN
4.0000 mg | Freq: Once | INTRAMUSCULAR | Status: AC
  Administered 2014-09-25: 4 mg via INTRAVENOUS
  Filled 2014-09-25: qty 1

## 2014-09-25 MED ORDER — IOHEXOL 350 MG/ML SOLN
150.0000 mL | Freq: Once | INTRAVENOUS | Status: AC | PRN
  Administered 2014-09-25: 150 mL via INTRAVENOUS

## 2014-09-25 NOTE — ED Provider Notes (Signed)
CSN: [id number]     Arrival date & time 09/25/14  1103 History   First MD Initiated Contact with Patient 09/25/14 1131     Chief Complaint  Patient presents with  . Shortness of Breath     (Consider location/radiation/quality/duration/timing/severity/associated sxs/prior Treatment) Patient is a 39 y.o. female presenting with shortness of breath. The history is provided by the patient and the spouse. No language interpreter was used.  Shortness of Breath Associated symptoms: chest pain and vomiting   Associated symptoms: no fever and no neck pain   Holly Gray is a 39 y.o female who presents for sudden onset sob with right sided chest and back pain that began at 10:30am today while bathing her child.  She states she vomited once which helped her symptoms. She is also complaining of right arm heaviness and numbness in the pinky. Her husband states they drove from [place] yesterday. Breathing deeply makes her pain worse. Nothing make her symptoms better. No treatment PTA.  She denies any fever, chills, dizziness, headache, vision changes.   Past Medical History  Diagnosis Date  . Medical history non-contributory    Past Surgical History  Procedure Laterality Date  . Cyst excision    . Cesarean section     Family History  Problem Relation Age of Onset  . Hypertension Mother    History  Substance Use Topics  . Smoking status: Never Smoker   . Smokeless tobacco: Not on file  . Alcohol Use: No   OB History    Gravida Para Term Preterm AB TAB SAB Ectopic Multiple Living   3 3 3       3      Review of Systems  Constitutional: Negative for fever.  Respiratory: Positive for shortness of breath.   Cardiovascular: Positive for chest pain.  Gastrointestinal: Positive for nausea and vomiting.  Musculoskeletal: Negative for neck pain and neck stiffness.  Neurological: Positive for weakness and numbness. Negative for dizziness, syncope, facial asymmetry and speech difficulty.  All other  systems reviewed and are negative.     Allergies  Mango flavor  Home Medications   Prior to Admission medications   Medication Sig Start Date End Date Taking? Authorizing Provider  acetaminophen (TYLENOL) 325 MG tablet Take 650 mg by mouth 2 (two) times daily as needed (pain).   Yes Historical Provider, MD  acetaminophen-codeine (TYLENOL #3) 300-30 MG per tablet Take 1-2 tablets by mouth every 6 (six) hours as needed for moderate pain. 06/07/14  Yes Lezlie Lye, NP  ferrous sulfate 325 (65 FE) MG tablet Take 325 mg by mouth daily.   Yes Historical Provider, MD  ibuprofen (ADVIL,MOTRIN) 200 MG tablet Take 400 mg by mouth every 6 (six) hours as needed for moderate pain.   Yes Historical Provider, MD  Polyethyl Glycol-Propyl Glycol (SYSTANE OP) Place 1 drop into both eyes 2 (two) times daily as needed (dry eyes).   Yes Historical Provider, MD   BP 121/73 mmHg  Pulse 72  Temp(Src) 98.2 F (36.8 C) (Oral)  Resp 18  Ht 5\' 1"  (1.549 m)  Wt 179 lb (81.194 kg)  BMI 33.84 kg/m2  SpO2 100%  LMP 09/22/2014 Physical Exam  Constitutional: She is oriented to person, place, and time. She appears well-developed and well-nourished.  HENT:  Head: Normocephalic and atraumatic.  No facial droop or speech impediments.   Eyes: Conjunctivae are normal.  Neck: Normal range of motion. Neck supple.  Cardiovascular: Normal rate, regular rhythm and normal heart sounds.  Pulmonary/Chest: Effort normal and breath sounds normal. No respiratory distress. She has no wheezes. She has no rales. She exhibits no tenderness.  Abdominal: Soft. She exhibits no distension and no mass. There is no tenderness. There is no rebound and no guarding.  Musculoskeletal: Normal range of motion.  Neurological: She is alert and oriented to person, place, and time. GCS eye subscore is 4. GCS verbal subscore is 5. GCS motor subscore is 6.  RUE weakness.  5/5 strength in LUE and bilateral lower extremities.  Cranial nerves  III-XII in tact.   Skin: Skin is warm and dry.  Psychiatric: She has a normal mood and affect. Her behavior is normal.  Nursing note and vitals reviewed.   ED Course  Procedures (including critical care time) Labs Review Labs Reviewed  CBC - Abnormal; Notable for the following:    WBC 3.9 (*)    Hemoglobin 9.1 (*)    HCT 29.1 (*)    MCV 74.8 (*)    MCH 23.4 (*)    RDW 16.0 (*)    All other components within normal limits  DIFFERENTIAL - Abnormal; Notable for the following:    Neutrophils Relative % 35 (*)    Neutro Abs 1.4 (*)    Lymphocytes Relative 51 (*)    All other components within normal limits  I-STAT CHEM 8, ED - Abnormal; Notable for the following:    Hemoglobin 10.9 (*)    HCT 32.0 (*)    All other components within normal limits  PROTIME-INR  APTT  COMPREHENSIVE METABOLIC PANEL  I-STAT TROPOININ, ED  CBG MONITORING, ED    Imaging Review Ct Angio Head W/cm &/or Wo Cm  09/25/2014   CLINICAL DATA:  Sudden onset of right-sided shoulder and neck pain. Right upper extremity weakness.  EXAM: CT ANGIOGRAPHY HEAD AND NECK  TECHNIQUE: Multidetector CT imaging of the head and neck was performed using the standard protocol during bolus administration of intravenous contrast. Multiplanar CT image reconstructions and MIPs were obtained to evaluate the vascular anatomy. Carotid stenosis measurements (when applicable) are obtained utilizing NASCET criteria, using the distal internal carotid diameter as the denominator.  CONTRAST:  161mL OMNIPAQUE IOHEXOL 350 MG/ML SOLN  COMPARISON:  None.  FINDINGS: CT HEAD  Brain: No acute cortical infarct, hemorrhage, or mass lesion is present. The ventricles are of normal size. No significant extra-axial fluid collection is evident. The source images demonstrate no acute or focal infarct.  Calvarium and skull base: Intact is  Paranasal sinuses: Clear E  Orbits: Within normal limits  CTA NECK  Aortic arch: A three-vessel creek arch is present. There  is no significant atherosclerotic calcification or stenosis at the aortic arch.  Right carotid system: The right common carotid artery is within normal limits. There is some motion distortion at the carotid bifurcation without significant stenosis. Mild tortuosity is present in the cervical right ICA without significant stenosis.  Left carotid system: The left common carotid artery is within normal limits. The carotid bifurcation is normal. The cervical left ICA is unremarkable.  Vertebral arteries:The vertebral arteries both originate from the subclavian arteries. The vertebral arteries are codominant. No significant stenoses are present in the neck.  Skeleton: Mild endplate degenerative changes are present at C7-T1. No focal lytic or blastic lesions are evident. There some straightening of the normal cervical lordosis without focal lesion.  Other neck: The thyroid and salivary glands are within normal limits. No focal mucosal or submucosal lesions are evident. Vocal cords are midline and  symmetric. No significant adenopathy is present.  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 The aneurysm is a nearly symmetric measuring 4.5 x 3 mm bilaterally with what appear to be narrowing next. There is no significant stenosis or other aneurysm through the ICA terminus. The A1 and M1 segments are within normal limits. The MCA bifurcations are intact. ACA and MCA branch vessels are unremarkable.  Posterior circulation: The left vertebral artery is slightly dominant above the foramen magnum. The right PICA origin is extradural. The left PICA is small but within normal limits. The basilar artery is within normal limits. Both posterior cerebral arteries originate from the basilar tip. Small posterior communicating arteries are present bilaterally as well. The PCA branch vessels are within normal limits.  Venous sinuses: The dural sinuses are patent. The straight sinus and deep cerebral  veins are within normal limits. Cortical veins are unremarkable.  Anatomic variants: None  IMPRESSION: 1. No acute or focal lesion to explain the patient's symptoms. No acute infarct. 2. 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 Both aneurysm superior to have a narrow neck. These results were called by telephone at the time of interpretation on 09/25/2014 at 12:18 p.m. to Dr. Alexis Goodell, who verbally acknowledged these results.   Electronically Signed   By: San Morelle M.D.   On: 09/25/2014 12:51   Ct Angio Neck W/cm &/or Wo/cm  09/25/2014   CLINICAL DATA:  Sudden onset of right-sided shoulder and neck pain. Right upper extremity weakness.  EXAM: CT ANGIOGRAPHY HEAD AND NECK  TECHNIQUE: Multidetector CT imaging of the head and neck was performed using the standard protocol during bolus administration of intravenous contrast. Multiplanar CT image reconstructions and MIPs were obtained to evaluate the vascular anatomy. Carotid stenosis measurements (when applicable) are obtained utilizing NASCET criteria, using the distal internal carotid diameter as the denominator.  CONTRAST:  117mL OMNIPAQUE IOHEXOL 350 MG/ML SOLN  COMPARISON:  None.  FINDINGS: CT HEAD  Brain: No acute cortical infarct, hemorrhage, or mass lesion is present. The ventricles are of normal size. No significant extra-axial fluid collection is evident. The source images demonstrate no acute or focal infarct.  Calvarium and skull base: Intact is  Paranasal sinuses: Clear E  Orbits: Within normal limits  CTA NECK  Aortic arch: A three-vessel creek arch is present. There is no significant atherosclerotic calcification or stenosis at the aortic arch.  Right carotid system: The right common carotid artery is within normal limits. There is some motion distortion at the carotid bifurcation without significant stenosis. Mild tortuosity is present in the cervical right ICA without significant stenosis.  Left carotid system: The  left common carotid artery is within normal limits. The carotid bifurcation is normal. The cervical left ICA is unremarkable.  Vertebral arteries:The vertebral arteries both originate from the subclavian arteries. The vertebral arteries are codominant. No significant stenoses are present in the neck.  Skeleton: Mild endplate degenerative changes are present at C7-T1. No focal lytic or blastic lesions are evident. There some straightening of the normal cervical lordosis without focal lesion.  Other neck: The thyroid and salivary glands are within normal limits. No focal mucosal or submucosal lesions are evident. Vocal cords are midline and symmetric. No significant adenopathy is present.  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 The aneurysm is a nearly symmetric measuring 4.5 x 3 mm bilaterally with what appear to be narrowing next. There is no significant stenosis  or other aneurysm through the ICA terminus. The A1 and M1 segments are within normal limits. The MCA bifurcations are intact. ACA and MCA branch vessels are unremarkable.  Posterior circulation: The left vertebral artery is slightly dominant above the foramen magnum. The right PICA origin is extradural. The left PICA is small but within normal limits. The basilar artery is within normal limits. Both posterior cerebral arteries originate from the basilar tip. Small posterior communicating arteries are present bilaterally as well. The PCA branch vessels are within normal limits.  Venous sinuses: The dural sinuses are patent. The straight sinus and deep cerebral veins are within normal limits. Cortical veins are unremarkable.  Anatomic variants: None  IMPRESSION: 1. No acute or focal lesion to explain the patient's symptoms. No acute infarct. 2. 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 Both aneurysm superior to have a narrow neck. These results were called by telephone at the time of interpretation  on 09/25/2014 at 12:18 p.m. to Dr. Alexis Goodell, who verbally acknowledged these results.   Electronically Signed   By: San Morelle M.D.   On: 09/25/2014 12:51   Mr Brain Wo Contrast  09/25/2014   CLINICAL DATA:  Acute onset of right upper extremity weakness and abnormal speech  EXAM: MRI HEAD WITHOUT CONTRAST  TECHNIQUE: Multiplanar, multiecho pulse sequences of the brain and surrounding structures were obtained without intravenous contrast.  COMPARISON:  CTA head and neck from the same day.  FINDINGS: The diffusion-weighted images demonstrate no evidence for acute or subacute infarction.  No acute hemorrhage or mass lesion is present. The ventricles are of normal size. No significant white matter disease is present. No significant extraaxial fluid collection is present.  Flow is present in the major intracranial arteries. The globes and orbits are intact. Mild mucosal thickening is present in the left maxillary sinus. The remaining paranasal sinuses and the mastoid air cells are clear.  Skullbase is within normal limits. Midline structures are unremarkable.  IMPRESSION: Negative MRI of the brain.   Electronically Signed   By: San Morelle M.D.   On: 09/25/2014 15:21   Ct Angio Chest Aorta W/cm &/or Wo/cm  09/25/2014   CLINICAL DATA:  39 year old with chest pain, cough and shortness of breath. Recent road trip.  EXAM: CT ANGIOGRAPHY CHEST WITH CONTRAST  TECHNIQUE: Multidetector CT imaging of the chest was performed using the standard protocol during bolus administration of intravenous contrast. Multiplanar CT image reconstructions and MIPs were obtained to evaluate the vascular anatomy.  CONTRAST:  128mL OMNIPAQUE IOHEXOL 350 MG/ML SOLN  COMPARISON:  10/24/2006  FINDINGS: There is no evidence for a pulmonary embolism. 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 This large arterial branch extends into the right lower lobe. There  is no associated parenchymal disease to suggest a sequestration. Normal caliber of the thoracic aorta without evidence of a dissection. There is no significant mediastinal or hilar lymphadenopathy. Imaging of the upper abdomen is unremarkable. Incidentally, there is a separate origin of the common hepatic artery and splenic artery from the aorta.  The trachea and mainstem bronchi are patent. Mild motion artifact on this examination. Lungs are clear with parenchymal disease or airspace disease.  No acute bone abnormality.  Review of the MIP images confirms the above findings.  IMPRESSION: Negative for pulmonary embolism.  No acute chest abnormality.  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 No parenchymal abnormalities associated with this anomalous  vessel. No evidence for a pulmonary sequestration.   Electronically Signed   By: Markus Daft M.D.   On: 09/25/2014 12:48     EKG interpretation 09/25/14 11:23:13  Vent. rate 85 BPM PR interval 174 ms QRS duration 72 ms QT/QTc 360/428 ms P-R-T axes 78 40 30 Normal sinus rhythm I agree with this EKG interpretation, Ottie Glazier, PA-C.  MDM   Final diagnoses:  Anemia, unspecified anemia type  Right arm weakness  Vitals stable. EKG is not concerning and troponin is negative. Patient is anemic but otherwise her labs are unremarkable. She states she has been anemic for years and used to take iron but only takes it on occasion now.  She is currently on her menstrual cycle which is most likely a contributing factor to her Hgb being 9.1. She is asymptomatic with no palpitations, dizziness, or pallor. CTA of chest is negative for Aortic dissection or PE.  CT neck and head are negative for acute infarct or lesion.  Dr. Doy Mince, with code stroke team ordered an MRI brain which was negative for any intracranial abnormality. She does have bilateral 4.5 x 3 mm para ophthalmic artery aneurysm on CT head.  I spoke to  Dr. Alexis Goodell who agrees that the patient can be discharged with neurosurgery follow up for ophthalmic artery aneurysm.  I spoke to Dr. Adline Mango PA regarding the ophthalmic artery aneurysm and he stated that the patient can follow up in the office. Patient and spouse agree with the plan.      Ottie Glazier, PA-C 09/25/14 1856  Holly Freiberg, MD 09/28/14 2222

## 2014-09-25 NOTE — ED Notes (Addendum)
Pt reports she was bathing her child and she had sudden onset SOB, R side of neck/should/back/chest pain, ear pain, nausea. She reports she vomited once which did improve the symptoms some.  A&Ox4, resp e/u. She also states her R arm, feels heavy, R arm grip is weaker than left and there is a tremor in that arm. Her husband states her speech is slower than normal. Onset of symptoms at 1030 am today.

## 2014-09-25 NOTE — ED Notes (Signed)
Dr. Reynolds at bedside.

## 2014-09-25 NOTE — Code Documentation (Signed)
39yo female arriving to [facility] via private vehicle at 91.  Patient presenting with c/o SOB and vomiting.  Patient reports that she was in the car for approx. 10 hours yesterday driving home from [place].  This morning at 1030 she had sudden onset right arm pain/heaviness while bathing her child.  She reports pain with movement of her right arm that has spread to her chest.  She has difficulty breathing and an associated cough.  Code stroke called in the ED.  Stroke team to the bedside.  NIHSS 1, see documentation for details and code stroke times.  Patient able to hold up right arm with encouragement, however, continues to report heaviness and pain.  Patient to CT with ED RN [place] and Stroke RN.  CT head, CTA chest, head and neck completed per orders.  Patient tolerated well and transported back to room.  Patient is too mild to treat with tPA at this time per Dr. Doy Mince, however, she remains in the window to treat with tPA until 1500 should symptoms worsen.  Bedside handoff with ED RN [place].

## 2014-09-25 NOTE — ED Notes (Signed)
CBG = 82  RN [place] and MD [place] informed of result.

## 2014-09-25 NOTE — ED Notes (Signed)
Pt placed in gown and in bed. Pt monitored by pulse ox, bp cuff, and 5-lead. MD Colin Rhein at bedside.

## 2014-09-25 NOTE — Consult Note (Signed)
Referring Physician: Colin Rhein    Chief Complaint: Code stroke  HPI:                                                                                                                                         Holly A Weyandt is an 39 y.o. female with no significant PMHx. Patient had finished a 12 hour car trip yesterday with her children.  Today she was giving her children a bath at 10, when she suddenly felt SOB, chest pain, right shoulder pain then a gaging sensation which led to a HA. She was brought to ED for her SOB but due to complaints of right arm weakness code stroke was called. Currently there is question if she has a PE versus stroke and patient is transported to CT for angio of chest, neck and brain.   Date last known well: Date: 09/25/2014 Time last known well: Time: 10:30 tPA Given: No: minimal symptoms   Past Medical History  Diagnosis Date  . Medical history non-contributory     Past Surgical History  Procedure Laterality Date  . Cyst excision    . Cesarean section      Family History  Problem Relation Age of Onset  . Hypertension Mother    Social History:  reports that she has never smoked. She does not have any smokeless tobacco history on file. She reports that she does not drink alcohol or use illicit drugs.  Allergies:  Allergies  Allergen Reactions  . Mango Flavor Itching    Medications:                                                                                                                           No current facility-administered medications for this encounter.   Current Outpatient Prescriptions  Medication Sig Dispense Refill  . acetaminophen (TYLENOL) 325 MG tablet Take 650 mg by mouth 2 (two) times daily as needed (pain).    Marland Kitchen acetaminophen-codeine (TYLENOL #3) 300-30 MG per tablet Take 1-2 tablets by mouth every 6 (six) hours as needed for moderate pain. 6 tablet 0  . ferrous sulfate 325 (65 FE) MG tablet Take 325 mg by mouth daily.    Marland Kitchen  ibuprofen (ADVIL,MOTRIN) 200 MG tablet Take 400 mg by mouth every 6 (six) hours as needed for moderate pain.    Vladimir Faster Glycol-Propyl Glycol (SYSTANE OP)  Place 1 drop into both eyes 2 (two) times daily as needed (dry eyes).       ROS:                                                                                                                                       History obtained from the patient  General ROS: negative for - chills, fatigue, fever, night sweats, weight gain or weight loss Psychological ROS: negative for - behavioral disorder, hallucinations, memory difficulties, mood swings or suicidal ideation Ophthalmic ROS: negative for - blurry vision, double vision, eye pain or loss of vision ENT ROS: negative for - epistaxis, nasal discharge, oral lesions, sore throat, tinnitus or vertigo Allergy and Immunology ROS: negative for - hives or itchy/watery eyes Hematological and Lymphatic ROS: negative for - bleeding problems, bruising or swollen lymph nodes Endocrine ROS: negative for - galactorrhea, hair pattern changes, polydipsia/polyuria or temperature intolerance Respiratory ROS: negative for - cough, hemoptysis, shortness of breath or wheezing Cardiovascular ROS: negative for - chest pain, dyspnea on exertion, edema or irregular heartbeat Gastrointestinal ROS: negative for - abdominal pain, diarrhea, hematemesis, nausea/vomiting or stool incontinence Genito-Urinary ROS: negative for - dysuria, hematuria, incontinence or urinary frequency/urgency Musculoskeletal ROS: negative for - joint swelling or muscular weakness Neurological ROS: as noted in HPI Dermatological ROS: negative for rash and skin lesion changes  Neurologic Examination:                                                                                                      Blood pressure 121/73, pulse 72, temperature 98.2 F (36.8 C), temperature source Oral, resp. rate 18, height 5\' 1"  (1.549 m), weight 81.194 kg  (179 lb), last menstrual period 09/22/2014, SpO2 100 %.  HEENT-  Normocephalic, no lesions, without obvious abnormality.  Normal external eye and conjunctiva.  Normal TM's bilaterally.  Normal auditory canals and external ears. Normal external nose, mucus membranes and septum.  Normal pharynx. Cardiovascular- regular rate and rhythm, pulses palpable throughout   Lungs- chest clear, no wheezing, rales, normal symmetric air entry Abdomen- normal findings: bowel sounds normal Extremities- no edema Lymph-no adenopathy palpable Musculoskeletal-no joint tenderness, deformity or swelling Skin-warm and dry, no hyperpigmentation, vitiligo, or suspicious lesions  Neurological Examination Mental Status: Alert, oriented, thought content appropriate.  Speech fluent without evidence of aphasia.  Able to follow 3 step commands without difficulty. Cranial Nerves: II: Discs flat bilaterally; Visual fields grossly normal, pupils equal, round, reactive to light and accommodation III,IV,  VI: ptosis not present, extra-ocular motions intact bilaterally V,VII: smile symmetric, facial light touch sensation normal bilaterally VIII: hearing normal bilaterally IX,X: uvula rises symmetrically XI: bilateral shoulder shrug XII: midline tongue extension Motor: Right : Upper extremity   4/5 (pain)   Left:     Upper extremity   5/5  Lower extremity   5/5     Lower extremity   5/5 Tone and bulk:normal tone throughout; no atrophy noted Sensory: Pinprick and light touch intact throughout, bilaterally Deep Tendon Reflexes: 2+ and symmetric throughout Plantars: Right: downgoing   Left: downgoing Cerebellar: normal finger-to-nose, and normal heel-to-shin test Gait: not tested due to safety    Lab Results: Basic Metabolic Panel:  Recent Labs Lab 09/25/14 1127 09/25/14 1139  NA 135 138  K 3.5 3.5  CL 104 104  CO2 22  --   GLUCOSE 95 94  BUN 12 13  CREATININE 0.46 0.50  CALCIUM 8.9  --     Liver Function  Tests:  Recent Labs Lab 09/25/14 1127  AST 27  ALT 26  ALKPHOS 51  BILITOT 0.3  PROT 6.8  ALBUMIN 3.5   No results for input(s): LIPASE, AMYLASE in the last 168 hours. No results for input(s): AMMONIA in the last 168 hours.  CBC:  Recent Labs Lab 09/25/14 1127 09/25/14 1139  WBC 3.9*  --   NEUTROABS 1.4*  --   HGB 9.1* 10.9*  HCT 29.1* 32.0*  MCV 74.8*  --   PLT 301  --     Cardiac Enzymes: No results for input(s): CKTOTAL, CKMB, CKMBINDEX, TROPONINI in the last 168 hours.  Lipid Panel: No results for input(s): CHOL, TRIG, HDL, CHOLHDL, VLDL, LDLCALC in the last 168 hours.  CBG:  Recent Labs Lab 09/25/14 1143  GLUCAP 82    Microbiology: Results for orders placed or performed during the hospital encounter of 06/04/14  Wet prep, genital     Status: Abnormal   Collection Time: 06/05/14  2:15 AM  Result Value Ref Range Status   Yeast Wet Prep HPF POC NONE SEEN NONE SEEN Final   Trich, Wet Prep NONE SEEN NONE SEEN Final   Clue Cells Wet Prep HPF POC NONE SEEN NONE SEEN Final   WBC, Wet Prep HPF POC FEW (A) NONE SEEN Final    Comment: MODERATE BACTERIA SEEN    Coagulation Studies:  Recent Labs  09/25/14 1127  LABPROT 14.9  INR 1.15    Imaging: Ct Angio Head W/cm &/or Wo Cm  09/25/2014   CLINICAL DATA:  Sudden onset of right-sided shoulder and neck pain. Right upper extremity weakness.  EXAM: CT ANGIOGRAPHY HEAD AND NECK  TECHNIQUE: Multidetector CT imaging of the head and neck was performed using the standard protocol during bolus administration of intravenous contrast. Multiplanar CT image reconstructions and MIPs were obtained to evaluate the vascular anatomy. Carotid stenosis measurements (when applicable) are obtained utilizing NASCET criteria, using the distal internal carotid diameter as the denominator.  CONTRAST:  130mL OMNIPAQUE IOHEXOL 350 MG/ML SOLN  COMPARISON:  None.  FINDINGS: CT HEAD  Brain: No acute cortical infarct, hemorrhage, or mass  lesion is present. The ventricles are of normal size. No significant extra-axial fluid collection is evident. The source images demonstrate no acute or focal infarct.  Calvarium and skull base: Intact is  Paranasal sinuses: Clear E  Orbits: Within normal limits  CTA NECK  Aortic arch: A three-vessel creek arch is present. There is no significant atherosclerotic calcification or stenosis at the  aortic arch.  Right carotid system: The right common carotid artery is within normal limits. There is some motion distortion at the carotid bifurcation without significant stenosis. Mild tortuosity is present in the cervical right ICA without significant stenosis.  Left carotid system: The left common carotid artery is within normal limits. The carotid bifurcation is normal. The cervical left ICA is unremarkable.  Vertebral arteries:The vertebral arteries both originate from the subclavian arteries. The vertebral arteries are codominant. No significant stenoses are present in the neck.  Skeleton: Mild endplate degenerative changes are present at C7-T1. No focal lytic or blastic lesions are evident. There some straightening of the normal cervical lordosis without focal lesion.  Other neck: The thyroid and salivary glands are within normal limits. No focal mucosal or submucosal lesions are evident. Vocal cords are midline and symmetric. No significant adenopathy is present.  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 The aneurysm is a nearly symmetric measuring 4.5 x 3 mm bilaterally with what appear to be narrowing next. There is no significant stenosis or other aneurysm through the ICA terminus. The A1 and M1 segments are within normal limits. The MCA bifurcations are intact. ACA and MCA branch vessels are unremarkable.  Posterior circulation: The left vertebral artery is slightly dominant above the foramen magnum. The right PICA origin is extradural. The left PICA is small but within  normal limits. The basilar artery is within normal limits. Both posterior cerebral arteries originate from the basilar tip. Small posterior communicating arteries are present bilaterally as well. The PCA branch vessels are within normal limits.  Venous sinuses: The dural sinuses are patent. The straight sinus and deep cerebral veins are within normal limits. Cortical veins are unremarkable.  Anatomic variants: None  IMPRESSION: 1. No acute or focal lesion to explain the patient's symptoms. No acute infarct. 2. 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 Both aneurysm superior to have a narrow neck. These results were called by telephone at the time of interpretation on 09/25/2014 at 12:18 p.m. to Dr. Alexis Goodell, who verbally acknowledged these results.   Electronically Signed   By: San Morelle M.D.   On: 09/25/2014 12:51   Ct Angio Neck W/cm &/or Wo/cm  09/25/2014   CLINICAL DATA:  Sudden onset of right-sided shoulder and neck pain. Right upper extremity weakness.  EXAM: CT ANGIOGRAPHY HEAD AND NECK  TECHNIQUE: Multidetector CT imaging of the head and neck was performed using the standard protocol during bolus administration of intravenous contrast. Multiplanar CT image reconstructions and MIPs were obtained to evaluate the vascular anatomy. Carotid stenosis measurements (when applicable) are obtained utilizing NASCET criteria, using the distal internal carotid diameter as the denominator.  CONTRAST:  132mL OMNIPAQUE IOHEXOL 350 MG/ML SOLN  COMPARISON:  None.  FINDINGS: CT HEAD  Brain: No acute cortical infarct, hemorrhage, or mass lesion is present. The ventricles are of normal size. No significant extra-axial fluid collection is evident. The source images demonstrate no acute or focal infarct.  Calvarium and skull base: Intact is  Paranasal sinuses: Clear E  Orbits: Within normal limits  CTA NECK  Aortic arch: A three-vessel creek arch is present. There is no significant  atherosclerotic calcification or stenosis at the aortic arch.  Right carotid system: The right common carotid artery is within normal limits. There is some motion distortion at the carotid bifurcation without significant stenosis. Mild tortuosity is present in the cervical right ICA without significant stenosis.  Left carotid system:  The left common carotid artery is within normal limits. The carotid bifurcation is normal. The cervical left ICA is unremarkable.  Vertebral arteries:The vertebral arteries both originate from the subclavian arteries. The vertebral arteries are codominant. No significant stenoses are present in the neck.  Skeleton: Mild endplate degenerative changes are present at C7-T1. No focal lytic or blastic lesions are evident. There some straightening of the normal cervical lordosis without focal lesion.  Other neck: The thyroid and salivary glands are within normal limits. No focal mucosal or submucosal lesions are evident. Vocal cords are midline and symmetric. No significant adenopathy is present.  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 The aneurysm is a nearly symmetric measuring 4.5 x 3 mm bilaterally with what appear to be narrowing next. There is no significant stenosis or other aneurysm through the ICA terminus. The A1 and M1 segments are within normal limits. The MCA bifurcations are intact. ACA and MCA branch vessels are unremarkable.  Posterior circulation: The left vertebral artery is slightly dominant above the foramen magnum. The right PICA origin is extradural. The left PICA is small but within normal limits. The basilar artery is within normal limits. Both posterior cerebral arteries originate from the basilar tip. Small posterior communicating arteries are present bilaterally as well. The PCA branch vessels are within normal limits.  Venous sinuses: The dural sinuses are patent. The straight sinus and deep cerebral veins are within  normal limits. Cortical veins are unremarkable.  Anatomic variants: None  IMPRESSION: 1. No acute or focal lesion to explain the patient's symptoms. No acute infarct. 2. 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 Both aneurysm superior to have a narrow neck. These results were called by telephone at the time of interpretation on 09/25/2014 at 12:18 p.m. to Dr. Alexis Goodell, who verbally acknowledged these results.   Electronically Signed   By: San Morelle M.D.   On: 09/25/2014 12:51   Mr Brain Wo Contrast  09/25/2014   CLINICAL DATA:  Acute onset of right upper extremity weakness and abnormal speech  EXAM: MRI HEAD WITHOUT CONTRAST  TECHNIQUE: Multiplanar, multiecho pulse sequences of the brain and surrounding structures were obtained without intravenous contrast.  COMPARISON:  CTA head and neck from the same day.  FINDINGS: The diffusion-weighted images demonstrate no evidence for acute or subacute infarction.  No acute hemorrhage or mass lesion is present. The ventricles are of normal size. No significant white matter disease is present. No significant extraaxial fluid collection is present.  Flow is present in the major intracranial arteries. The globes and orbits are intact. Mild mucosal thickening is present in the left maxillary sinus. The remaining paranasal sinuses and the mastoid air cells are clear.  Skullbase is within normal limits. Midline structures are unremarkable.  IMPRESSION: Negative MRI of the brain.   Electronically Signed   By: San Morelle M.D.   On: 09/25/2014 15:21   Ct Angio Chest Aorta W/cm &/or Wo/cm  09/25/2014   CLINICAL DATA:  39 year old with chest pain, cough and shortness of breath. Recent road trip.  EXAM: CT ANGIOGRAPHY CHEST WITH CONTRAST  TECHNIQUE: Multidetector CT imaging of the chest was performed using the standard protocol during bolus administration of intravenous contrast. Multiplanar CT image reconstructions and MIPs were obtained  to evaluate the vascular anatomy.  CONTRAST:  13mL OMNIPAQUE IOHEXOL 350 MG/ML SOLN  COMPARISON:  10/24/2006  FINDINGS: There is no evidence for a pulmonary embolism. 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 This large arterial branch extends into the right lower lobe. There is no associated parenchymal disease to suggest a sequestration. Normal caliber of the thoracic aorta without evidence of a dissection. There is no significant mediastinal or hilar lymphadenopathy. Imaging of the upper abdomen is unremarkable. Incidentally, there is a separate origin of the common hepatic artery and splenic artery from the aorta.  The trachea and mainstem bronchi are patent. Mild motion artifact on this examination. Lungs are clear with parenchymal disease or airspace disease.  No acute bone abnormality.  Review of the MIP images confirms the above findings.  IMPRESSION: Negative for pulmonary embolism.  No acute chest abnormality.  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 No parenchymal abnormalities associated with this anomalous vessel. No evidence for a pulmonary sequestration.   Electronically Signed   By: Markus Daft M.D.   On: 09/25/2014 12:48     Etta Quill PA-C [facility] [phone]  09/25/2014, 4:13 PM   Patient seen and examined.  Clinical course and management discussed.  Necessary edits performed.  I agree with the above.  Assessment and plan of care developed and discussed below.   Assessment: 39 y.o. female presenting with right sided weakness and pain.  NIHSS of 1.  Not felt to be a tPA candidate or intervention candidate due to lack of disabling symptoms.  Head CT personally reviewed and shows no acute changes.  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  Patient s/p a prolonged car ride.  No evidence of PE.  Has not progressed since presentation.  Patient  remains within the window.  Further work up recommended.    Stroke Risk Factors - none   Recommendations: 1.  MRI of the brain.  Further work up to be determined based on results of imaging.     Alexis Goodell, MD [facility] [phone]  09/25/2014  4:13 PM  Addendum: MRI of the brain personally reviewed and shows no acute infarct.  Symptoms felt to be secondary to pain.   Recommendations: 1.  No further neurologic intervention is recommended at this time.  Patient will require neurosurgical work up as an outpatient to further evaluate aneurysms found on imaging.  If further questions arise, please call or page at that time.  Thank you for allowing neurology to participate in the care of this patient.  Alexis Goodell, MD [facility] [phone] 09/25/2014  4:14 PM

## 2014-09-25 NOTE — ED Notes (Signed)
Patient transported to MRI 

## 2014-09-26 ENCOUNTER — Other Ambulatory Visit (HOSPITAL_COMMUNITY): Payer: Self-pay | Admitting: Interventional Radiology

## 2014-09-26 DIAGNOSIS — I671 Cerebral aneurysm, nonruptured: Secondary | ICD-10-CM

## 2014-09-27 ENCOUNTER — Ambulatory Visit (HOSPITAL_COMMUNITY)
Admission: RE | Admit: 2014-09-27 | Discharge: 2014-09-27 | Disposition: A | Discharge status: Home / Self Care | Payer: Self-pay | Source: Ambulatory Visit | Attending: Interventional Radiology | Admitting: Interventional Radiology

## 2014-09-27 DIAGNOSIS — I671 Cerebral aneurysm, nonruptured: Secondary | ICD-10-CM

## 2014-10-04 ENCOUNTER — Other Ambulatory Visit (HOSPITAL_COMMUNITY): Payer: Self-pay | Admitting: Interventional Radiology

## 2014-10-04 ENCOUNTER — Telehealth (HOSPITAL_COMMUNITY): Payer: Self-pay | Admitting: Interventional Radiology

## 2014-10-04 DIAGNOSIS — I671 Cerebral aneurysm, nonruptured: Secondary | ICD-10-CM

## 2014-10-04 NOTE — Telephone Encounter (Signed)
Called pt, left VM for her to call to schedule her intervention. JM

## 2014-10-05 ENCOUNTER — Telehealth (HOSPITAL_COMMUNITY): Payer: Self-pay | Admitting: Interventional Radiology

## 2014-10-05 NOTE — Telephone Encounter (Signed)
Called pt, left VM for her to call to schedule intervention. JM

## 2014-10-13 ENCOUNTER — Other Ambulatory Visit: Payer: Self-pay | Admitting: Radiology

## 2014-10-17 ENCOUNTER — Telehealth (HOSPITAL_COMMUNITY): Payer: Self-pay

## 2014-10-17 ENCOUNTER — Encounter (HOSPITAL_COMMUNITY)
Admission: RE | Admit: 2014-10-17 | Discharge: 2014-10-17 | Disposition: A | Discharge status: Home / Self Care | Payer: Self-pay | Source: Ambulatory Visit | Attending: Interventional Radiology | Admitting: Interventional Radiology

## 2014-10-17 ENCOUNTER — Encounter (HOSPITAL_COMMUNITY): Payer: Self-pay

## 2014-10-17 DIAGNOSIS — I671 Cerebral aneurysm, nonruptured: Secondary | ICD-10-CM | POA: Insufficient documentation

## 2014-10-17 DIAGNOSIS — Z01812 Encounter for preprocedural laboratory examination: Secondary | ICD-10-CM | POA: Insufficient documentation

## 2014-10-17 DIAGNOSIS — Z01818 Encounter for other preprocedural examination: Secondary | ICD-10-CM | POA: Insufficient documentation

## 2014-10-17 HISTORY — DX: Bronchitis, not specified as acute or chronic: J40

## 2014-10-17 HISTORY — DX: Asymptomatic varicose veins of unspecified lower extremity: I83.90

## 2014-10-17 HISTORY — DX: Anxiety disorder, unspecified: F41.9

## 2014-10-17 LAB — CBC WITH DIFFERENTIAL/PLATELET
BASOS PCT: 1 % (ref 0–1)
Basophils Absolute: 0 10*3/uL (ref 0.0–0.1)
Eosinophils Absolute: 0.2 10*3/uL (ref 0.0–0.7)
Eosinophils Relative: 4 % (ref 0–5)
HCT: 29 % — ABNORMAL LOW (ref 36.0–46.0)
Hemoglobin: 8.9 g/dL — ABNORMAL LOW (ref 12.0–15.0)
Lymphocytes Relative: 39 % (ref 12–46)
Lymphs Abs: 1.6 10*3/uL (ref 0.7–4.0)
MCH: 22.8 pg — ABNORMAL LOW (ref 26.0–34.0)
MCHC: 30.7 g/dL (ref 30.0–36.0)
MCV: 74.4 fL — ABNORMAL LOW (ref 78.0–100.0)
MONOS PCT: 10 % (ref 3–12)
Monocytes Absolute: 0.4 10*3/uL (ref 0.1–1.0)
Neutro Abs: 1.9 10*3/uL (ref 1.7–7.7)
Neutrophils Relative %: 46 % (ref 43–77)
Platelets: 266 10*3/uL (ref 150–400)
RBC: 3.9 MIL/uL (ref 3.87–5.11)
RDW: 16.9 % — ABNORMAL HIGH (ref 11.5–15.5)
WBC: 4.1 10*3/uL (ref 4.0–10.5)

## 2014-10-17 LAB — BASIC METABOLIC PANEL
Anion gap: 5 (ref 5–15)
BUN: 15 mg/dL (ref 6–20)
CHLORIDE: 108 mmol/L (ref 101–111)
CO2: 24 mmol/L (ref 22–32)
CREATININE: 0.6 mg/dL (ref 0.44–1.00)
Calcium: 9 mg/dL (ref 8.9–10.3)
GFR calc non Af Amer: >60 mL/min (ref >60)
GLUCOSE: 98 mg/dL (ref 65–99)
Potassium: 4 mmol/L (ref 3.5–5.1)
SODIUM: 137 mmol/L (ref 135–145)

## 2014-10-17 LAB — HCG, SERUM, QUALITATIVE: Preg, Serum: NEGATIVE

## 2014-10-17 LAB — PROTIME-INR
INR: 1.11 (ref 0.00–1.49)
Prothrombin Time: 14.5 seconds (ref 11.6–15.2)

## 2014-10-17 LAB — APTT: aPTT: 28 seconds (ref 24–37)

## 2014-10-17 NOTE — Progress Notes (Signed)
Patient arrived to PAT and was very pleasant.   PCP is Dixie Dials. Patient informed Nurse that she had a stress test with in the last five years. Will request records. Patient denied having a cardiac cath or sleep study.   Patient denied having any acute cardiac or pulmonary issues  Patient voiced concern about being on menstrual cycle DOS. Nurse explained to patient that it was "ok," but patient still wanted Nurse to inform Dr. Estanislado Pandy to ensure it was ok. Nurse paged Levan Hurst, [place] returned page at [phone] and Nurse informed her of patients concern. Koreen stated that it was "ok" as well. Afterwards patient stated "thank you, now I feel better, I just wanted someone to know.Marland KitchenMarland KitchenMarland KitchenI didn't want anything to be in the way since they are going in through my groin."  Patient had prescription for Plavix and stated she was going to have it filled today, and that she would pick up a 325 mg aspirin while getting Plavix. Patient stated she would start medication tomorrow 10-18-14 (as orders stated patient needed to start both Plavix and aspirin five days before surgery). Nurse instructed patient to take Plavix and aspirin DOS with a small sip of water. Patient verbalized understanding.

## 2014-10-17 NOTE — Pre-Procedure Instructions (Signed)
Risha A Spare  10/17/2014     Your procedure is scheduled on : Monday October 23, 2014 at 8:30 AM.  Report to [facility] Admitting at 6:30 A.M.  Call this number if you have problems the morning of surgery: [phone]   Remember:  Do not eat food or drink liquids after midnight.  Take these medicines the morning of surgery with A SIP OF WATER : Acetaminophen (Tylenol) if needed, Plavix, and Aspirin   Please stop taking any vitamins, herbal medications, Ibuprofen, Advil, Motrin, etc    Do not wear jewelry, make-up or nail polish.  Do not wear lotions, powders, or perfumes.    Do not shave 48 hours prior to surgery.    Do not bring valuables to the hospital.  [facility] is not responsible for any belongings or valuables.  Contacts, dentures or bridgework may not be worn into surgery.  Leave your suitcase in the car.  After surgery it may be brought to your room.  For patients admitted to the hospital, discharge time will be determined by your treatment team.  Patients discharged the day of surgery will not be allowed to drive home.   Name and phone number of your driver:    Special instructions:  Shower using CHG soap the night before and the morning of your surgery  Please read over the following fact sheets that you were given. Pain Booklet, Coughing and Deep Breathing and Surgical Site Infection Prevention

## 2014-10-17 NOTE — Telephone Encounter (Signed)
Returned pt's call regarding questions about upcoming procedure, left message for pt to call back. AW

## 2014-10-18 NOTE — Progress Notes (Signed)
Anesthesia Chart Review:  Pt is 39 year old female scheduled for angiogram of intracanal aneurysm, possible endovascular intervention on 10/30/2014 with Dr. Estanislado Pandy.   PMH include: anxiety. Never smoker. BMI 30.   Medications include: tylenol, ferrous sulfate. Pt to start plavix and ASA 5 days prior to procedure.   Preoperative labs reviewed.  H/H 8.9/29.0. By ED notes 09/25/14, pt reported hx of anemia "for years"; hgb was 9.1 at that time. Pt reported at PAT yesterday she was currently on menstrual cycle.   Chest x-ray 12/24/2013 reviewed. No active cardiopulmonary disease. Mild thoracic dextroscoliosis.   EKG 09/25/2014: NSR.   Exercise stress test 08/01/2008: -No chest pain -No dysrhythmia -No EKG changes of ischemia -EKG and chest pain negative -maximum work load 11.0 METS  Reviewed labs with Dr. Linna Caprice. T&S ordered for DOS.   If no changes, I anticipate pt can proceed with surgery as scheduled.   Willeen Cass, FNP-BC [facility]/Anesthesiology Phone: [phone] 10/18/2014 4:02 PM

## 2014-10-23 ENCOUNTER — Ambulatory Visit (HOSPITAL_COMMUNITY): Admission: RE | Admit: 2014-10-23 | Payer: Self-pay | Source: Ambulatory Visit

## 2014-10-24 NOTE — Progress Notes (Signed)
Pt had called and requested a nurse to return her call. I called pt and her concern was that she was born in [place] and they do not record their date of birth. She states that she may be 69 years older than what is stated on her chart, possibly younger than stated age. She states it would not be any more than 84 years older or younger. She states she has been very concerned that we should know this and wanted to know if the age difference could be an issue. I re-assured her that 5 years shouldn't be a problem for her procedure to be done. She expressed relief and gratitude with my answer.

## 2014-10-26 ENCOUNTER — Other Ambulatory Visit: Payer: Self-pay | Admitting: Radiology

## 2014-10-27 ENCOUNTER — Other Ambulatory Visit: Payer: Self-pay | Admitting: Radiology

## 2014-10-28 ENCOUNTER — Other Ambulatory Visit: Payer: Self-pay | Admitting: Radiology

## 2014-10-30 ENCOUNTER — Ambulatory Visit (HOSPITAL_COMMUNITY): Payer: MEDICAID | Admitting: Emergency Medicine

## 2014-10-30 ENCOUNTER — Ambulatory Visit (HOSPITAL_COMMUNITY)
Admission: RE | Admit: 2014-10-30 | Discharge: 2014-10-30 | Disposition: A | Discharge status: Home / Self Care | Payer: Self-pay | Source: Ambulatory Visit | Attending: Interventional Radiology | Admitting: Interventional Radiology

## 2014-10-30 ENCOUNTER — Encounter (HOSPITAL_COMMUNITY): Payer: Self-pay | Admitting: Certified Registered Nurse Anesthetist

## 2014-10-30 ENCOUNTER — Inpatient Hospital Stay (HOSPITAL_COMMUNITY)
Admission: RE | Admit: 2014-10-30 | Discharge: 2014-10-31 | DRG: 027 | Disposition: A | Discharge status: Home / Self Care | Payer: Self-pay | Source: Ambulatory Visit | Attending: Interventional Radiology | Admitting: Interventional Radiology

## 2014-10-30 ENCOUNTER — Ambulatory Visit (HOSPITAL_COMMUNITY): Payer: Self-pay | Admitting: Anesthesiology

## 2014-10-30 ENCOUNTER — Encounter (HOSPITAL_COMMUNITY)
Admission: RE | Disposition: A | Discharge status: Home / Self Care | Payer: Self-pay | Source: Ambulatory Visit | Attending: Interventional Radiology

## 2014-10-30 DIAGNOSIS — Z79899 Other long term (current) drug therapy: Secondary | ICD-10-CM

## 2014-10-30 DIAGNOSIS — Z7982 Long term (current) use of aspirin: Secondary | ICD-10-CM

## 2014-10-30 DIAGNOSIS — M549 Dorsalgia, unspecified: Secondary | ICD-10-CM | POA: Diagnosis present

## 2014-10-30 DIAGNOSIS — I671 Cerebral aneurysm, nonruptured: Principal | ICD-10-CM | POA: Diagnosis present

## 2014-10-30 DIAGNOSIS — Z91018 Allergy to other foods: Secondary | ICD-10-CM

## 2014-10-30 DIAGNOSIS — Z7902 Long term (current) use of antithrombotics/antiplatelets: Secondary | ICD-10-CM

## 2014-10-30 DIAGNOSIS — Z791 Long term (current) use of non-steroidal anti-inflammatories (NSAID): Secondary | ICD-10-CM

## 2014-10-30 HISTORY — PX: RADIOLOGY WITH ANESTHESIA: SHX6223

## 2014-10-30 LAB — ABO/RH: ABO/RH(D): AB POS

## 2014-10-30 LAB — MRSA PCR SCREENING: MRSA by PCR: NEGATIVE

## 2014-10-30 LAB — TYPE AND SCREEN
ABO/RH(D): AB POS
Antibody Screen: NEGATIVE

## 2014-10-30 LAB — APTT: APTT: 48 s — AB (ref 24–37)

## 2014-10-30 LAB — PLATELET INHIBITION P2Y12: PLATELET FUNCTION P2Y12: 159 [PRU] — AB (ref 194–418)

## 2014-10-30 LAB — POCT ACTIVATED CLOTTING TIME
Activated Clotting Time: 128 seconds
Activated Clotting Time: 165 seconds
Activated Clotting Time: 165 seconds

## 2014-10-30 LAB — HEPARIN LEVEL (UNFRACTIONATED): Heparin Unfractionated: 0.32 IU/mL (ref 0.30–0.70)

## 2014-10-30 SURGERY — RADIOLOGY WITH ANESTHESIA
Anesthesia: General

## 2014-10-30 MED ORDER — NIMODIPINE 30 MG PO CAPS
0.0000 mg | ORAL_CAPSULE | ORAL | Status: AC
  Administered 2014-10-30: 60 mg via ORAL

## 2014-10-30 MED ORDER — ACETAMINOPHEN 650 MG RE SUPP: 650.0000 mg | Freq: Four times a day (QID) | RECTAL | Status: DC | PRN

## 2014-10-30 MED ORDER — PROPOFOL INFUSION 10 MG/ML OPTIME
INTRAVENOUS | Status: DC | PRN
  Administered 2014-10-30: 100 ug/kg/min via INTRAVENOUS

## 2014-10-30 MED ORDER — LIDOCAINE HCL (CARDIAC) 20 MG/ML IV SOLN
INTRAVENOUS | Status: DC | PRN
  Administered 2014-10-30: 80 mg via INTRAVENOUS

## 2014-10-30 MED ORDER — SODIUM CHLORIDE 0.9 % IV SOLN: INTRAVENOUS | Status: DC

## 2014-10-30 MED ORDER — IOHEXOL 300 MG/ML  SOLN: 150.0000 mL | Freq: Once | INTRAMUSCULAR | Status: DC | PRN

## 2014-10-30 MED ORDER — HEPARIN (PORCINE) IN NACL 100-0.45 UNIT/ML-% IJ SOLN
600.0000 [IU]/h | INTRAMUSCULAR | Status: DC
  Administered 2014-10-30: 500 [IU]/h via INTRAVENOUS
  Filled 2014-10-30: qty 250

## 2014-10-30 MED ORDER — CLOPIDOGREL BISULFATE 75 MG PO TABS: 75.0000 mg | ORAL_TABLET | ORAL | Status: DC

## 2014-10-30 MED ORDER — PROMETHAZINE HCL 25 MG/ML IJ SOLN: 6.2500 mg | INTRAMUSCULAR | Status: DC | PRN

## 2014-10-30 MED ORDER — NEOSTIGMINE METHYLSULFATE 10 MG/10ML IV SOLN
INTRAVENOUS | Status: DC | PRN
  Administered 2014-10-30: 3 mg via INTRAVENOUS

## 2014-10-30 MED ORDER — LIDOCAINE HCL 1 % IJ SOLN
INTRAMUSCULAR | Status: AC
  Filled 2014-10-30: qty 20

## 2014-10-30 MED ORDER — MIDAZOLAM HCL 5 MG/5ML IJ SOLN
INTRAMUSCULAR | Status: DC | PRN
  Administered 2014-10-30: 2 mg via INTRAVENOUS

## 2014-10-30 MED ORDER — HEPARIN (PORCINE) IN NACL 100-0.45 UNIT/ML-% IJ SOLN
INTRAMUSCULAR | Status: AC
  Filled 2014-10-30: qty 250

## 2014-10-30 MED ORDER — KETOROLAC TROMETHAMINE 30 MG/ML IJ SOLN
30.0000 mg | Freq: Four times a day (QID) | INTRAMUSCULAR | Status: AC
  Administered 2014-10-30 – 2014-10-31 (×2): 30 mg via INTRAVENOUS
  Filled 2014-10-30 (×2): qty 1

## 2014-10-30 MED ORDER — ASPIRIN 325 MG PO TABS
325.0000 mg | ORAL_TABLET | Freq: Every day | ORAL | Status: DC
  Administered 2014-10-31: 325 mg via ORAL
  Filled 2014-10-30 (×2): qty 1

## 2014-10-30 MED ORDER — IOHEXOL 300 MG/ML  SOLN
150.0000 mL | Freq: Once | INTRAMUSCULAR | Status: DC | PRN
  Administered 2014-10-30: 100 mL via INTRAVENOUS
  Filled 2014-10-30: qty 150

## 2014-10-30 MED ORDER — NITROGLYCERIN 1 MG/10 ML FOR IR/CATH LAB
INTRA_ARTERIAL | Status: AC
  Filled 2014-10-30: qty 10

## 2014-10-30 MED ORDER — ACETAMINOPHEN 500 MG PO TABS
1000.0000 mg | ORAL_TABLET | Freq: Four times a day (QID) | ORAL | Status: DC | PRN
  Administered 2014-10-30 – 2014-10-31 (×2): 1000 mg via ORAL
  Filled 2014-10-30 (×2): qty 2

## 2014-10-30 MED ORDER — NICARDIPINE HCL IN NACL 20-0.86 MG/200ML-% IV SOLN
5.0000 mg/h | INTRAVENOUS | Status: DC
Start: 2014-10-30 — End: 2014-10-31

## 2014-10-30 MED ORDER — CLOPIDOGREL BISULFATE 75 MG PO TABS
75.0000 mg | ORAL_TABLET | Freq: Every day | ORAL | Status: DC
  Administered 2014-10-31: 75 mg via ORAL
  Filled 2014-10-30 (×2): qty 1

## 2014-10-30 MED ORDER — ONDANSETRON HCL 4 MG/2ML IJ SOLN
INTRAMUSCULAR | Status: DC | PRN
  Administered 2014-10-30 (×2): 4 mg via INTRAVENOUS

## 2014-10-30 MED ORDER — LACTATED RINGERS IV SOLN
INTRAVENOUS | Status: DC
  Administered 2014-10-30: 09:00:00 via INTRAVENOUS

## 2014-10-30 MED ORDER — CEFAZOLIN SODIUM-DEXTROSE 2-3 GM-% IV SOLR
2.0000 g | Freq: Once | INTRAVENOUS | Status: AC
  Administered 2014-10-30: 2 g via INTRAVENOUS

## 2014-10-30 MED ORDER — ONDANSETRON HCL 4 MG/2ML IJ SOLN
4.0000 mg | Freq: Four times a day (QID) | INTRAMUSCULAR | Status: DC | PRN
  Administered 2014-10-31: 4 mg via INTRAVENOUS
  Filled 2014-10-30 (×2): qty 2

## 2014-10-30 MED ORDER — CEFAZOLIN SODIUM-DEXTROSE 2-3 GM-% IV SOLR
INTRAVENOUS | Status: AC
  Filled 2014-10-30: qty 50

## 2014-10-30 MED ORDER — HEPARIN SODIUM (PORCINE) 1000 UNIT/ML IJ SOLN
INTRAMUSCULAR | Status: DC | PRN
  Administered 2014-10-30: 1000 [IU] via INTRAVENOUS
  Administered 2014-10-30: 500 [IU] via INTRAVENOUS
  Administered 2014-10-30: 3000 [IU] via INTRAVENOUS
  Administered 2014-10-30: 1000 [IU] via INTRAVENOUS

## 2014-10-30 MED ORDER — ROCURONIUM BROMIDE 100 MG/10ML IV SOLN
INTRAVENOUS | Status: DC | PRN
  Administered 2014-10-30: 10 mg via INTRAVENOUS
  Administered 2014-10-30: 50 mg via INTRAVENOUS

## 2014-10-30 MED ORDER — SODIUM CHLORIDE 0.9 % IV SOLN
10.0000 mg | INTRAVENOUS | Status: DC | PRN
  Administered 2014-10-30: 10 ug/min via INTRAVENOUS

## 2014-10-30 MED ORDER — PROPOFOL 10 MG/ML IV BOLUS
INTRAVENOUS | Status: DC | PRN
  Administered 2014-10-30: 170 mg via INTRAVENOUS
  Administered 2014-10-30: 30 mg via INTRAVENOUS

## 2014-10-30 MED ORDER — ASPIRIN EC 325 MG PO TBEC: 325.0000 mg | DELAYED_RELEASE_TABLET | ORAL | Status: DC

## 2014-10-30 MED ORDER — FENTANYL CITRATE (PF) 100 MCG/2ML IJ SOLN
INTRAMUSCULAR | Status: DC | PRN
  Administered 2014-10-30 (×2): 50 ug via INTRAVENOUS
  Administered 2014-10-30: 75 ug via INTRAVENOUS
  Administered 2014-10-30: 25 ug via INTRAVENOUS

## 2014-10-30 MED ORDER — GLYCOPYRROLATE 0.2 MG/ML IJ SOLN
INTRAMUSCULAR | Status: DC | PRN
  Administered 2014-10-30: 0.4 mg via INTRAVENOUS

## 2014-10-30 MED ORDER — HYDROMORPHONE HCL 1 MG/ML IJ SOLN: 0.2500 mg | INTRAMUSCULAR | Status: DC | PRN

## 2014-10-30 MED ORDER — NIMODIPINE 30 MG PO CAPS
ORAL_CAPSULE | ORAL | Status: AC
  Filled 2014-10-30: qty 2

## 2014-10-30 NOTE — Anesthesia Procedure Notes (Addendum)
Procedure Name: MAC Date/Time: 10/30/2014 9:00 AM Performed by: Rogers Blocker Pre-anesthesia Checklist: Patient identified, Emergency Drugs available, Suction available, Patient being monitored and Timeout performed Patient Re-evaluated:Patient Re-evaluated prior to inductionOxygen Delivery Method: Simple face mask Placement Confirmation: positive ETCO2   Procedure Name: Intubation Date/Time: 10/30/2014 10:07 AM Performed by: Rogers Blocker Pre-anesthesia Checklist: Patient identified, Emergency Drugs available, Suction available, Patient being monitored and Timeout performed Patient Re-evaluated:Patient Re-evaluated prior to inductionOxygen Delivery Method: Circle system utilized Preoxygenation: Pre-oxygenation with 100% oxygen Intubation Type: IV induction Ventilation: Mask ventilation without difficulty Laryngoscope Size: Mac and 3 Grade View: Grade I Tube type: Oral Tube size: 7.5 mm Number of attempts: 1 Airway Equipment and Method: Stylet Placement Confirmation: ETT inserted through vocal cords under direct vision,  positive ETCO2,  CO2 detector and breath sounds checked- equal and bilateral Secured at: 22 cm Tube secured with: Tape Dental Injury: Teeth and Oropharynx as per pre-operative assessment

## 2014-10-30 NOTE — Anesthesia Postprocedure Evaluation (Signed)
  Anesthesia Post-op Note  Patient: Holly Gray  Procedure(s) Performed: Procedure(s): RADIOLOGY WITH ANESTHESIA (N/A)  Patient Location: PACU  Anesthesia Type:General  Level of Consciousness: awake and alert   Airway and Oxygen Therapy: Patient Spontanous Breathing  Post-op Pain: none  Post-op Assessment: Post-op Vital signs reviewed LLE Motor Response: Purposeful movement, Responds to commands LLE Sensation: No numbness RLE Motor Response: Purposeful movement, Responds to commands RLE Sensation: No numbness      Post-op Vital Signs: stable  Last Vitals:  Filed Vitals:   10/30/14 1400  BP: 109/66  Pulse: 70  Temp:   Resp: 21    Complications: No apparent anesthesia complications

## 2014-10-30 NOTE — Progress Notes (Signed)
ANTICOAGULATION CONSULT NOTE - Initial Consult  Pharmacy Consult for heparin Indication: post-interventional neuroradiological procedure   Allergies  Allergen Reactions  . Mango Flavor Itching    Patient Measurements: Height: 5\' 5"  (165.1 cm) Weight: 175 lb (79.379 kg) IBW/kg (Calculated) : 57 Heparin Dosing Weight: 74.2kg  Vital Signs: Temp: 97.2 F (36.2 C) (08/15 1315) Temp Source: Oral (08/15 0707) BP: 122/69 mmHg (08/15 1330) Pulse Rate: 77 (08/15 1315)  Labs: No results for input(s): HGB, HCT, PLT, APTT, LABPROT, INR, HEPARINUNFRC, CREATININE, CKTOTAL, CKMB, TROPONINI in the last 72 hours.  Estimated Creatinine Clearance: 96.4 mL/min (by C-G formula based on Cr of 0.6).   Medical History: Past Medical History  Diagnosis Date  . Medical history non-contributory   . Bronchitis     hx of  . Anxiety   . Varicose veins      Assessment: 61 yof with newly found intracranial aneurysms on MR and CTA imaging. S/p cerebral arteriogram and embolization. Pharmacy consulted to dose heparin post-interventional neuroradiological procedure on 8/15. No bolus. No bleed documented. No recent labs .  Goal of Therapy:  HL 0.1-0.25 Monitor platelets by anticoagulation protocol: Yes   Plan:  Heparin IV to 750 units/h Stop date 8/16 @0700  when sheath pulled 6h HL Mon s/sx bleeding   Elicia Lamp, PharmD Clinical Pharmacist Pager [phone] 10/30/2014 1:57 PM

## 2014-10-30 NOTE — Progress Notes (Signed)
Slight new drainage noticed on groin site which increased over time. Manual pressure held for 10 minutes and 10lb sand bag applied. Drainage still noted. Heparin gtt decreased per pharmacy. Drainage appeared to be decreasing. Dr. Estanislado Pandy notified. Order for patient to remain flat, no turning side to side and for a stat APTT to be drawn. Will monitor.

## 2014-10-30 NOTE — H&P (Signed)
Chief Complaint: "I'm here for an angiogram"  HPI: Holly Gray is an 39 y.o. female with newly found intracranial aneurysms on MR and CTA imaging. She was seen by Dr. Estanislado Pandy in consult and is now scheduled for cerebral arteriogram with intent to treat these aneurysms by coil or stent assisted embo. Pt has been feeling ok. Has taken ASA and Plavix as directed. Denies recent illness, fevers, chills. Has been NPO this am. PMHx, meds, labs, imaging reviewed.  Past Medical History:  Past Medical History  Diagnosis Date  . Medical history non-contributory   . Bronchitis     hx of  . Anxiety   . Varicose veins     Past Surgical History:  Past Surgical History  Procedure Laterality Date  . Cyst excision    . Cesarean section      Family History:  Family History  Problem Relation Age of Onset  . Hypertension Mother     Social History:  reports that she has never smoked. She does not have any smokeless tobacco history on file. She reports that she does not drink alcohol or use illicit drugs.  Allergies:  Allergies  Allergen Reactions  . Mango Flavor Itching    Medications: Plavix 75mg  daily ASA 325mg  daily  Please HPI for pertinent positives, otherwise complete 10 system ROS negative.  Physical Exam: Temp: 98.4, HR: 79, BP: 146/90, RR: 20   General Appearance:  Alert, cooperative, no distress, appears stated age  Head:  Normocephalic, without obvious abnormality, atraumatic  ENT: Unremarkable  Neck: Supple, symmetrical, trachea midline  Lungs:   Clear to auscultation bilaterally, no w/r/r, respirations unlabored without use of accessory muscles.  Heart:  Regular rate and rhythm, S1, S2 normal, no murmur, rub or gallop.  Extremities: Extremities normal, atraumatic, no cyanosis or edema  Pulses: 2+ and symmetric femoral  Neurologic: Normal affect, no gross deficits.  Labs: CBC    Component Value Date/Time   WBC 4.1 10/17/2014 1040   RBC 3.90 10/17/2014 1040    HGB 8.9* 10/17/2014 1040   HCT 29.0* 10/17/2014 1040   PLT 266 10/17/2014 1040   MCV 74.4* 10/17/2014 1040   MCH 22.8* 10/17/2014 1040   MCHC 30.7 10/17/2014 1040   RDW 16.9* 10/17/2014 1040   LYMPHSABS 1.6 10/17/2014 1040   MONOABS 0.4 10/17/2014 1040   EOSABS 0.2 10/17/2014 1040   BASOSABS 0.0 10/17/2014 1040   BMET    Component Value Date/Time   NA 137 10/17/2014 1040   K 4.0 10/17/2014 1040   CL 108 10/17/2014 1040   CO2 24 10/17/2014 1040   GLUCOSE 98 10/17/2014 1040   BUN 15 10/17/2014 1040   CREATININE 0.60 10/17/2014 1040   CALCIUM 9.0 10/17/2014 1040   GFRNONAA >60 10/17/2014 1040   GFRAA >60 10/17/2014 1040    INR/Prothrombin Time: 14.5/1.1  P2Y12: 159  Assessment/Plan Intracranial aneurysms Plan for cerebral arteriogram and possible embolization Labs reviewed, await P2Y12 Procedure, risks, complications, use of sedation/general anesthesia discussed. Plan for overnight admit for observation if intervention performed. Consent signed in chart  Ascencion Dike PA-C 10/30/2014, 8:13 AM

## 2014-10-30 NOTE — Sedation Documentation (Signed)
Pt transported via stretcher accompanied by this RN and CRNA to Neuro PACU. Pt is sleepy but easily arousable, able to follow commands and directions, verbal. Groins site appeared C/D/I and WDL. Report given to PACU RN, groin site assessed again with out any complications noted, Quick neuro assessment performed without difficulty.

## 2014-10-30 NOTE — Progress Notes (Signed)
  Subjective: Pt seen in afternoon rounds with Dr. 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 No headache, no nausea. C/o mid-upper back pain, from laying flat.  Objective: Physical Exam: BP 109/66 mmHg  Pulse 72  Temp(Src) 97.2 F (36.2 C) (Oral)  Resp 20  Ht 5\' 5"  (1.651 m)  Wt 175 lb (79.379 kg)  BMI 29.12 kg/m2  SpO2 100% A&Ox3 No drift PERRLA, EOMI Face symmetric, tongue midline, speech clear. Fine motor intact and equal Strength 5/5 UE and LE  Ext: (R)groin soft, NT, no hematoma Feet war, excellent pedal pulses   Assessment/Plan: S/p cerebral arteriogram with treatment of (L)periopthalmic aneurysm with Pipeline flow diverter device Doing well, cont heparin overnight Will order some Toradol for back pain. Slowly advance diet Anticipate discharge tomorrow.    LOS: 0 days    Ascencion Dike PA-C 10/30/2014 3:31 PM

## 2014-10-30 NOTE — Anesthesia Preprocedure Evaluation (Addendum)
Anesthesia Evaluation  Patient identified by MRN, date of birth, ID band Patient awake    Reviewed: Allergy & Precautions, NPO status , Patient's Chart, lab work & pertinent test results  Airway Mallampati: II       Dental  (+) Teeth Intact   Pulmonary neg pulmonary ROS,  breath sounds clear to auscultation        Cardiovascular hypertension, negative cardio ROS  Rhythm:Regular Rate:Normal     Neuro/Psych negative neurological ROS     GI/Hepatic negative GI ROS, Neg liver ROS,   Endo/Other  negative endocrine ROS  Renal/GU negative Renal ROS     Musculoskeletal   Abdominal   Peds  Hematology negative hematology ROS (+)   Anesthesia Other Findings   Reproductive/Obstetrics                            Anesthesia Physical Anesthesia Plan  ASA: II  Anesthesia Plan: General   Post-op Pain Management:    Induction: Intravenous  Airway Management Planned: Oral ETT  Additional Equipment:   Intra-op Plan:   Post-operative Plan: Extubation in OR  Informed Consent: I have reviewed the patients History and Physical, chart, labs and discussed the procedure including the risks, benefits and alternatives for the proposed anesthesia with the patient or authorized representative who has indicated his/her understanding and acceptance.   Dental advisory given  Plan Discussed with: CRNA and Surgeon  Anesthesia Plan Comments:         Anesthesia Quick Evaluation

## 2014-10-30 NOTE — Procedures (Signed)
S/P bilateral common carotid arteriograms. RT CFA approach. Findings. 1. approx 4.101mm x 4.2 mm Lt periophthalmic aneurysm. 2.Approx 4.4 mm x 3.9 mm RT periophthalmic aneurysm. S/P Lt ICA periophthalmic aneurysm embolization using pipeline flow diverter device.

## 2014-10-30 NOTE — Transfer of Care (Signed)
Immediate Anesthesia Transfer of Care Note  Patient: Holly Gray  Procedure(s) Performed: Procedure(s): RADIOLOGY WITH ANESTHESIA (N/A)  Patient Location: PACU  Anesthesia Type:General  Level of Consciousness: awake, alert , oriented and patient cooperative  Airway & Oxygen Therapy: Patient Spontanous Breathing and Patient connected to nasal cannula oxygen  Post-op Assessment: Report given to RN, Post -op Vital signs reviewed and stable and Patient moving all extremities  Post vital signs: Reviewed and stable  Last Vitals:  Filed Vitals:   10/30/14 0707  BP: 146/90  Pulse: 79  Temp: 36.9 C  Resp: 20    Complications: No apparent anesthesia complications

## 2014-10-31 ENCOUNTER — Inpatient Hospital Stay (HOSPITAL_COMMUNITY): Payer: Self-pay

## 2014-10-31 ENCOUNTER — Encounter (HOSPITAL_COMMUNITY): Payer: Self-pay | Admitting: Interventional Radiology

## 2014-10-31 ENCOUNTER — Other Ambulatory Visit: Payer: Self-pay | Admitting: Radiology

## 2014-10-31 DIAGNOSIS — I671 Cerebral aneurysm, nonruptured: Secondary | ICD-10-CM

## 2014-10-31 LAB — CBC WITH DIFFERENTIAL/PLATELET
Basophils Absolute: 0 10*3/uL (ref 0.0–0.1)
Basophils Relative: 1 % (ref 0–1)
Eosinophils Absolute: 0.1 10*3/uL (ref 0.0–0.7)
Eosinophils Relative: 2 % (ref 0–5)
HEMATOCRIT: 24.9 % — AB (ref 36.0–46.0)
HEMOGLOBIN: 7.7 g/dL — AB (ref 12.0–15.0)
LYMPHS ABS: 1.7 10*3/uL (ref 0.7–4.0)
LYMPHS PCT: 39 % (ref 12–46)
MCH: 23.4 pg — AB (ref 26.0–34.0)
MCHC: 30.9 g/dL (ref 30.0–36.0)
MCV: 75.7 fL — AB (ref 78.0–100.0)
MONOS PCT: 10 % (ref 3–12)
Monocytes Absolute: 0.4 10*3/uL (ref 0.1–1.0)
NEUTROS ABS: 2.1 10*3/uL (ref 1.7–7.7)
NEUTROS PCT: 48 % (ref 43–77)
Platelets: 255 10*3/uL (ref 150–400)
RBC: 3.29 MIL/uL — ABNORMAL LOW (ref 3.87–5.11)
RDW: 17.5 % — ABNORMAL HIGH (ref 11.5–15.5)
WBC: 4.3 10*3/uL (ref 4.0–10.5)

## 2014-10-31 LAB — BASIC METABOLIC PANEL
ANION GAP: 5 (ref 5–15)
BUN: <5 mg/dL — ABNORMAL LOW (ref 6–20)
CHLORIDE: 108 mmol/L (ref 101–111)
CO2: 24 mmol/L (ref 22–32)
Calcium: 8 mg/dL — ABNORMAL LOW (ref 8.9–10.3)
Creatinine, Ser: 0.51 mg/dL (ref 0.44–1.00)
GFR calc non Af Amer: >60 mL/min (ref >60)
GLUCOSE: 88 mg/dL (ref 65–99)
Potassium: 3.6 mmol/L (ref 3.5–5.1)
Sodium: 137 mmol/L (ref 135–145)

## 2014-10-31 LAB — PLATELET INHIBITION P2Y12: PLATELET FUNCTION P2Y12: 128 [PRU] — AB (ref 194–418)

## 2014-10-31 MED ORDER — SODIUM CHLORIDE 0.9 % IV BOLUS (SEPSIS)
250.0000 mL | Freq: Once | INTRAVENOUS | Status: AC
  Administered 2014-10-31: 250 mL via INTRAVENOUS

## 2014-10-31 NOTE — Progress Notes (Signed)
   Subjective: Patient with c/o headache last night resolved with Tylenol, she c/o "stars" in her field of vision and lightheadedness when standing today. She denies any headache currently or right groin site pain, bleeding or swelling. She denies any nausea or vomiting. She denies any extremity weakness. She does admit to tinnitus that is chronic and not new for her.   Allergies: Mango flavor  Medications: Prior to Admission medications   Medication Sig Start Date End Date Taking? Authorizing Provider  acetaminophen (TYLENOL) 325 MG tablet Take 650 mg by mouth 2 (two) times daily as needed (pain).   Yes Historical Provider, MD  aspirin 325 MG tablet Take 325 mg by mouth daily.   Yes Historical Provider, MD  clopidogrel (PLAVIX) 75 MG tablet Take 75 mg by mouth daily.   Yes Historical Provider, MD  ferrous sulfate 325 (65 FE) MG tablet Take 325 mg by mouth daily with breakfast.   Yes Historical Provider, MD  ibuprofen (ADVIL,MOTRIN) 200 MG tablet Take 400 mg by mouth every 6 (six) hours as needed for moderate pain.   Yes Historical Provider, MD  Polyethyl Glycol-Propyl Glycol (SYSTANE OP) Place 1 drop into both eyes 2 (two) times daily as needed (dry eyes).    Historical Provider, MD    Vital Signs: BP 116/80 mmHg  Pulse 66  Temp(Src) 98.3 F (36.8 C) (Oral)  Resp 20  Ht 5\' 5"  (1.651 m)  Wt 175 lb 4.3 oz (79.5 kg)  BMI 29.17 kg/m2  SpO2 100%  LMP 10/25/2014  Physical Exam General: A&Ox3, NAD Heart: RRR without M/G/R Lungs: CTA b/l Abd: Soft, NT, ND Ext: RCFA dressing intact, NT, no signs of bleeding/hematoma, DP 1+ b/l Neuro: Speech clear, face symmetrical, EOMI, no drift, equal strength upper and lower ext, fine motor intact  Labs:  CBC:  Recent Labs  12/24/13 1201 09/25/14 1127 09/25/14 1139 10/17/14 1040 10/31/14 0545  WBC 3.4* 3.9*  --  4.1 4.3  HGB 10.0* 9.1* 10.9* 8.9* 7.7*  HCT 31.6* 29.1* 32.0* 29.0* 24.9*  PLT 280 301  --  266 255    COAGS:  Recent  Labs  09/25/14 1127 10/17/14 1040 10/30/14 2300  INR 1.15 1.11  --   APTT 27 28 48*    BMP:  Recent Labs  12/24/13 1201 09/25/14 1127 09/25/14 1139 10/17/14 1040 10/31/14 0545  NA 137 135 138 137 137  K 4.0 3.5 3.5 4.0 3.6  CL 101 104 104 108 108  CO2 23 22  --  24 24  GLUCOSE 86 95 94 98 88  BUN 11 12 13 15  <5*  CALCIUM 9.2 8.9  --  9.0 8.0*  CREATININE 0.59 0.46 0.50 0.60 0.51  GFRNONAA >90 >60  --  >60 >60  GFRAA >90 >60  --  >60 >60    LIVER FUNCTION TESTS:  Recent Labs  09/25/14 1127  BILITOT 0.3  AST 27  ALT 26  ALKPHOS 51  PROT 6.8  ALBUMIN 3.5    Assessment and Plan: S/p cerebral arteriogram with treatment of (L)periopthalmic aneurysm with Pipeline flow diverter device 8/15 Will advance diet further and continue IV fluids today Back pain, will order T-spine xray per Dr. Estanislado Pandy  Will order p2y12 Possible discharge today if stable Anemia, patient does take Iron supplements    Signed: Hedy Jacob 10/31/2014, 12:26 PM

## 2014-10-31 NOTE — Discharge Summary (Signed)
Patient ID: Holly Gray MRN: [id number] DOB/AGE: [birth year] 39 y.o.  Admit date: 10/30/2014 Discharge date: 10/31/2014  Admission Diagnoses:  Bilateral periophthalmic aneurysms   Discharge Diagnoses:  Active Problems:   Brain aneurysm S/p cerebral arteriogram with treatment of (L) ICA periopthalmic aneurysm with Pipeline flow diverter device 10/30/14  Discharged Condition: Good, stable  Hospital Course: The patient is a 39 year old right-handed lady, originally from [place] who was seen in consultation with Dr. Estanislado Pandy on 09/27/14 for evaluation regarding the finding of intracranial aneurysms. The patient was recently in the emergency room with symptoms which she describes as a relatively acute onset of right-sided shoulder pain radiating into her right arm associated with mild tingling. This also is associated with tightness and dyspnea extending into her neck. This was associated with some nausea and vomiting which apparently relieved her symptoms. During the workup she had an MRI scan of the brain and also a CT angiogram of the brain. The CT angiogram of the brain revealed the discovery of mirror periophthalmic region aneurysms each measuring approximately 4.5 mm. She was deemed a candidate for endovascular treatment.  She presented as an outpatient on 10/30/14 and underwent successful bilateral common carotid arteriogram with general anesthesia. RT CFA approach. Findings. 1. approx 4.59mm x 4.2 mm Lt periophthalmic aneurysm. 2.Approx 4.4 mm x 3.9 mm RT periophthalmic aneurysm. S/P Lt ICA periophthalmic aneurysm embolization using pipeline flow diverter device.  She was admitted overnight in the Neuro ICU with close observation. She has been extubated successfully with good air movement. She has c/o some headache but this has been relieved with Tylenol. She c/o ongoing tinnitus which is chronic and unchanged. She denies any extremity weakness or speech changes. She does admit to  some lightheadedness and was found to have low H/H and is known to have anemia that she takes Iron for. She was given a fluid bolus and instructed to follow up with her PCP and also see a gynecologist regarding her fibroids. She will was instructed to continue her Iron and Plavix 75 mg Daily and ASA 325 mg daily after p2y12 was drawn today. She denies any chest pain, but does c/o ongoing back pain and thoracic spine xrays were done per Dr. Arlean Hopping request. She denies any shortness of breath, fever, chills or right groin site pain, bleeding or swelling. She will follow up in 2 weeks and at that time will discuss treatment of her right sided aneurysm. She was given the below instructions and verbalizes understanding.   Consults: Anesthesia   Treatments: S/p cerebral arteriogram with treatment of (L) ICA periopthalmic aneurysm with Pipeline flow diverter device 8/15  Discharge Exam: Blood pressure 111/70, pulse 85, temperature 98.3 F (36.8 C), temperature source Oral, resp. rate 18, height 5\' 5"  (1.651 m), weight 175 lb 4.3 oz (79.5 kg), last menstrual period 10/25/2014, SpO2 100 %.  General: A&Ox3, NAD, sitting up in bed eating lunch  Heart: RRR without M/G/R Lungs: CTA b/l Abd: Soft, NT, ND, (+) BS Ext: RCFA dressing intact, NT, no signs of bleeding/hematoma, DP 1+ b/l Neuro: Speech clear, face symmetrical, EOMI, no drift, equal strength upper and lower ext, fine motor intact  Disposition: 01-Home or Self Care  Discharge Instructions    Call MD for:  difficulty breathing, headache or visual disturbances    Complete by:  As directed      Call MD for:  extreme fatigue    Complete by:  As directed      Call MD for:  hives    Complete by:  As directed      Call MD for:  persistant dizziness or light-headedness    Complete by:  As directed      Call MD for:  persistant nausea and vomiting    Complete by:  As directed      Call MD for:  redness, tenderness, or signs of infection (pain,  swelling, redness, odor or green/yellow discharge around incision site)    Complete by:  As directed      Call MD for:  severe uncontrolled pain    Complete by:  As directed      Call MD for:  temperature >100.4    Complete by:  As directed      Diet - low sodium heart healthy    Complete by:  As directed      Driving Restrictions    Complete by:  As directed   No driving x 2 weeks     Increase activity slowly    Complete by:  As directed      Lifting restrictions    Complete by:  As directed   No lifting > 20 lbs, bending or stooping x 2 weeks     Remove dressing in 24 hours    Complete by:  As directed             Medication List    TAKE these medications        acetaminophen 325 MG tablet  Commonly known as:  TYLENOL  Take 650 mg by mouth 2 (two) times daily as needed (pain).     aspirin 325 MG tablet  Take 325 mg by mouth daily.     clopidogrel 75 MG tablet  Commonly known as:  PLAVIX  Take 75 mg by mouth daily.     ferrous sulfate 325 (65 FE) MG tablet  Take 325 mg by mouth daily with breakfast.     ibuprofen 200 MG tablet  Commonly known as:  ADVIL,MOTRIN  Take 400 mg by mouth every 6 (six) hours as needed for moderate pain.     SYSTANE OP  Place 1 drop into both eyes 2 (two) times daily as needed (dry eyes).           Follow-up Information    Follow up with DEVESHWAR, Fritz Pickerel, MD In 2 weeks.   Specialty:  Interventional Radiology   Why:  Our office will call with appointment [phone]   Contact information:   [address] [place] [address] [phone]        Signed: Hedy Jacob 10/31/2014, 1:25 PM   I have spent Greater Than 30 Minutes discharging [name].

## 2014-10-31 NOTE — Progress Notes (Signed)
Patient Holly Gray home via car with husband.  DC instructions were given and fully understood by patient.  Vital signs and assessments were stable.

## 2014-11-17 ENCOUNTER — Ambulatory Visit (HOSPITAL_COMMUNITY)
Admission: RE | Admit: 2014-11-17 | Discharge: 2014-11-17 | Disposition: A | Discharge status: Home / Self Care | Payer: Self-pay | Source: Ambulatory Visit | Attending: Radiology | Admitting: Radiology

## 2014-11-17 ENCOUNTER — Other Ambulatory Visit: Payer: Self-pay | Admitting: Radiology

## 2014-11-17 DIAGNOSIS — I671 Cerebral aneurysm, nonruptured: Secondary | ICD-10-CM | POA: Insufficient documentation

## 2014-11-17 DIAGNOSIS — Z539 Procedure and treatment not carried out, unspecified reason: Secondary | ICD-10-CM | POA: Insufficient documentation

## 2014-11-17 LAB — PLATELET INHIBITION P2Y12: PLATELET FUNCTION P2Y12: 8 [PRU] — AB (ref 194–418)

## 2014-11-19 ENCOUNTER — Inpatient Hospital Stay (HOSPITAL_COMMUNITY): Payer: Self-pay

## 2014-11-19 ENCOUNTER — Encounter (HOSPITAL_COMMUNITY): Payer: Self-pay | Admitting: *Deleted

## 2014-11-19 ENCOUNTER — Inpatient Hospital Stay (HOSPITAL_COMMUNITY)
Admission: AD | Admit: 2014-11-19 | Discharge: 2014-11-19 | Disposition: A | Discharge status: Home / Self Care | Payer: Self-pay | Source: Ambulatory Visit | Attending: Obstetrics and Gynecology | Admitting: Obstetrics and Gynecology

## 2014-11-19 DIAGNOSIS — N852 Hypertrophy of uterus: Secondary | ICD-10-CM | POA: Insufficient documentation

## 2014-11-19 DIAGNOSIS — N939 Abnormal uterine and vaginal bleeding, unspecified: Secondary | ICD-10-CM | POA: Insufficient documentation

## 2014-11-19 DIAGNOSIS — Z7982 Long term (current) use of aspirin: Secondary | ICD-10-CM | POA: Insufficient documentation

## 2014-11-19 DIAGNOSIS — D649 Anemia, unspecified: Secondary | ICD-10-CM | POA: Insufficient documentation

## 2014-11-19 LAB — CBC WITH DIFFERENTIAL/PLATELET
BASOS ABS: 0 10*3/uL (ref 0.0–0.1)
BASOS PCT: 1 % (ref 0–1)
EOS ABS: 0.1 10*3/uL (ref 0.0–0.7)
Eosinophils Relative: 2 % (ref 0–5)
HCT: 24.9 % — ABNORMAL LOW (ref 36.0–46.0)
Hemoglobin: 7.7 g/dL — ABNORMAL LOW (ref 12.0–15.0)
Lymphocytes Relative: 18 % (ref 12–46)
Lymphs Abs: 1.1 10*3/uL (ref 0.7–4.0)
MCH: 23.8 pg — ABNORMAL LOW (ref 26.0–34.0)
MCHC: 30.9 g/dL (ref 30.0–36.0)
MCV: 77.1 fL — ABNORMAL LOW (ref 78.0–100.0)
MONO ABS: 0.4 10*3/uL (ref 0.1–1.0)
MONOS PCT: 7 % (ref 3–12)
Neutro Abs: 4.4 10*3/uL (ref 1.7–7.7)
Neutrophils Relative %: 72 % (ref 43–77)
PLATELETS: 261 10*3/uL (ref 150–400)
RBC: 3.23 MIL/uL — ABNORMAL LOW (ref 3.87–5.11)
RDW: 20 % — AB (ref 11.5–15.5)
WBC: 6.1 10*3/uL (ref 4.0–10.5)

## 2014-11-19 LAB — POCT PREGNANCY, URINE: PREG TEST UR: NEGATIVE

## 2014-11-19 MED ORDER — MEGESTROL ACETATE 40 MG PO TABS: 40.0000 mg | ORAL_TABLET | Freq: Three times a day (TID) | ORAL | Status: DC

## 2014-11-19 NOTE — MAU Provider Note (Signed)
History     CSN: [id number]  Arrival date and time: 11/19/14 5027   First Provider Initiated Contact with Patient 11/19/14 [phone]      Chief Complaint  Patient presents with  . Vaginal Bleeding   HPI Holly Gray 39 y.o. nonpregnant female presents with heavy vaginal bleeding.  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  She started her period 2 days ago.  This morning, she started bleeding very heavily.   She feels very tired but denies weakness, syncope, SOB, CP.  She has mild HA.  She reports known fibroid but does not have gynecologist. OB History    Gravida Para Term Preterm AB TAB SAB Ectopic Multiple Living   3 3 3       3       Past Medical History  Diagnosis Date  . Medical history non-contributory   . Bronchitis     hx of  . Anxiety   . Varicose veins     Past Surgical History  Procedure Laterality Date  . Cyst excision    . Cesarean section    . Radiology with anesthesia N/A 10/30/2014    Procedure: RADIOLOGY WITH ANESTHESIA;  Surgeon: Luanne Bras, MD;  Location: [place];  Service: Radiology;  Laterality: N/A;    Family History  Problem Relation Age of Onset  . Hypertension Mother     Social History  Substance Use Topics  . Smoking status: Never Smoker   . Smokeless tobacco: None  . Alcohol Use: No    Allergies:  Allergies  Allergen Reactions  . Mango Flavor Itching    Prescriptions prior to admission  Medication Sig Dispense Refill Last Dose  . aspirin 325 MG tablet Take 325 mg by mouth daily.   11/19/2014 at Unknown time  . clopidogrel (PLAVIX) 75 MG tablet Take 75 mg by mouth daily.   11/19/2014 at Unknown time  . ferrous sulfate 325 (65 FE) MG tablet Take 325 mg by mouth daily with breakfast.   11/19/2014 at Unknown time  . acetaminophen (TYLENOL) 325 MG tablet Take 650 mg by mouth 2 (two) times daily as needed (pain).   Past Month at Unknown time  . ibuprofen (ADVIL,MOTRIN) 200 MG tablet Take 400 mg by mouth every 6  (six) hours as needed for moderate pain.   Past Month at Unknown time  . Polyethyl Glycol-Propyl Glycol (SYSTANE OP) Place 1 drop into both eyes 2 (two) times daily as needed (dry eyes).   More than a month at Unknown time    ROS Pertinent ROS in HPI.  All other systems are negative.   Physical Exam   Blood pressure 131/82, pulse 91, temperature 98.1 F (36.7 C), temperature source Oral, resp. rate 16, height 5\' 5"  (1.651 m), weight 179 lb 4 oz (81.307 kg), last menstrual period 11/18/2014, SpO2 100 %.  Physical Exam  Constitutional: She is oriented to person, place, and time. She appears well-developed and well-nourished. No distress.  HENT:  Head: Normocephalic and atraumatic.  Eyes: EOM are normal.  Neck: Normal range of motion.  Cardiovascular: Normal rate.   Respiratory: Effort normal. No respiratory distress.  GI: Soft. Bowel sounds are normal. She exhibits no distension. There is no tenderness.  Genitourinary:  Small amt of active bleeding from os.  Dark red blood.   Fundus is enlarged.   No adnexal mass or tenderness.  No CMT.  Musculoskeletal: Normal range of motion.  Neurological: She is alert and oriented  to person, place, and time.  Skin: Skin is warm and dry.  Psychiatric: She has a normal mood and affect.   Results for orders placed or performed during the hospital encounter of 11/19/14 (from the past 24 hour(s))  Pregnancy, urine POC     Status: None   Collection Time: 11/19/14  9:15 AM  Result Value Ref Range   Preg Test, Ur NEGATIVE NEGATIVE  CBC with Differential/Platelet     Status: Abnormal   Collection Time: 11/19/14  9:51 AM  Result Value Ref Range   WBC 6.1 4.0 - 10.5 K/uL   RBC 3.23 (L) 3.87 - 5.11 MIL/uL   Hemoglobin 7.7 (L) 12.0 - 15.0 g/dL   HCT 24.9 (L) 36.0 - 46.0 %   MCV 77.1 (L) 78.0 - 100.0 fL   MCH 23.8 (L) 26.0 - 34.0 pg   MCHC 30.9 30.0 - 36.0 g/dL   RDW 20.0 (H) 11.5 - 15.5 %   Platelets 261 150 - 400 K/uL   Neutrophils Relative % 72  43 - 77 %   Neutro Abs 4.4 1.7 - 7.7 K/uL   Lymphocytes Relative 18 12 - 46 %   Lymphs Abs 1.1 0.7 - 4.0 K/uL   Monocytes Relative 7 3 - 12 %   Monocytes Absolute 0.4 0.1 - 1.0 K/uL   Eosinophils Relative 2 0 - 5 %   Eosinophils Absolute 0.1 0.0 - 0.7 K/uL   Basophils Relative 1 0 - 1 %   Basophils Absolute 0.0 0.0 - 0.1 K/uL   US Transvaginal Non-ob  11/19/2014   CLINICAL DATA:  Enlarged, bulky uterus on physical examination. Heavy vaginal bleeding. The patient is taking blood thinners and has known fibroids and an enlarged uterus.  EXAM: TRANSABDOMINAL AND TRANSVAGINAL ULTRASOUND OF PELVIS  TECHNIQUE: Both transabdominal and transvaginal ultrasound examinations of the pelvis were performed. Transabdominal technique was performed for global imaging of the pelvis including uterus, ovaries, adnexal regions, and pelvic cul-de-sac. It was necessary to proceed with endovaginal exam following the transabdominal exam to visualize the uterus and endometrium in better detail.  COMPARISON:  06/07/2014.  FINDINGS: Uterus  Measurements: 10.1 x 7.6 x 5.5 cm. Diffusely heterogeneous. Previously demonstrated small hypoechoic areas are smaller. One of these currently measures 7 x 7 x 4 mm and the other measures 10 x 8 x 3 mm. The larger previously measured 14 x 9 x 7 mm and the smaller previously measured 11 x 9 x 8 mm. A previously demonstrated 1.5 cm hypoechoic area in the fundus is not currently visualized.  Endometrium  Thickness: 4.4 mm. No focal abnormality visualized. The fundal portion is not well visualized  Right ovary  Measurements: 3.0 x 1.9 x 1.9 cm. Normal appearance/no adnexal mass.  Left ovary  Measurements: 2.3 x 1.4 x 1.3 cm. Normal appearance/no adnexal mass.  Other findings  No free fluid.  IMPRESSION: 1. Diffusely enlarged and heterogeneous uterus, suggesting the possibility of adenomyosis. 2. Interval decrease in size of 2 small, probable cystic areas within the myometrium with interval lack of  visualization of a previously seen 1.5 cm oval hypoechoic area. 3. Normal appearing ovaries.   Electronically Signed   By: Claudie Revering M.D.   On: 11/19/2014 11:09   US Pelvis Complete  11/19/2014   CLINICAL DATA:  Enlarged, bulky uterus on physical examination. Heavy vaginal bleeding. The patient is taking blood thinners and has known fibroids and an enlarged uterus.  EXAM: TRANSABDOMINAL AND TRANSVAGINAL ULTRASOUND OF PELVIS  TECHNIQUE: Both  transabdominal and transvaginal ultrasound examinations of the pelvis were performed. Transabdominal technique was performed for global imaging of the pelvis including uterus, ovaries, adnexal regions, and pelvic cul-de-sac. It was necessary to proceed with endovaginal exam following the transabdominal exam to visualize the uterus and endometrium in better detail.  COMPARISON:  06/07/2014.  FINDINGS: Uterus  Measurements: 10.1 x 7.6 x 5.5 cm. Diffusely heterogeneous. Previously demonstrated small hypoechoic areas are smaller. One of these currently measures 7 x 7 x 4 mm and the other measures 10 x 8 x 3 mm. The larger previously measured 14 x 9 x 7 mm and the smaller previously measured 11 x 9 x 8 mm. A previously demonstrated 1.5 cm hypoechoic area in the fundus is not currently visualized.  Endometrium  Thickness: 4.4 mm. No focal abnormality visualized. The fundal portion is not well visualized  Right ovary  Measurements: 3.0 x 1.9 x 1.9 cm. Normal appearance/no adnexal mass.  Left ovary  Measurements: 2.3 x 1.4 x 1.3 cm. Normal appearance/no adnexal mass.  Other findings  No free fluid.  IMPRESSION: 1. Diffusely enlarged and heterogeneous uterus, suggesting the possibility of adenomyosis. 2. Interval decrease in size of 2 small, probable cystic areas within the myometrium with interval lack of visualization of a previously seen 1.5 cm oval hypoechoic area. 3. Normal appearing ovaries.   Electronically Signed   By: Claudie Revering M.D.   On: 11/19/2014 11:09   MAU Course   Procedures  MDM Discussed U/S, labs and pt presentation in setting of recent brain aneurysm with new blood thinners with Dr. Glo Herring.  He advises for Megace 40mg  TID x 5 days.  Disp #45.   Pt to be seen by GYN for followup.  Encourage IUD.    Assessment and Plan  A:  1. Abnormal uterine bleeding (AUB)   2. Bulky or enlarged uterus   3. Anemia, unspecified anemia type    P: Discharge to home Megace 40mg  tid x 5 days F/u in [place].  Email sent for clinic to call to set up GYN appt.  Patient may return to [facility]U as needed for GYN concern or if her condition were to change or worsen [facility] for other emergency   Paticia Stack 11/19/2014, 9:37 AM

## 2014-11-19 NOTE — Discharge Instructions (Signed)
Abnormal Uterine Bleeding Abnormal uterine bleeding can affect women at various stages in life, including teenagers, women in their reproductive years, pregnant women, and women who have reached menopause. Several kinds of uterine bleeding are considered abnormal, including:  Bleeding or spotting between periods.   Bleeding after sexual intercourse.   Bleeding that is heavier or more than normal.   Periods that last longer than usual.  Bleeding after menopause.  Many cases of abnormal uterine bleeding are minor and simple to treat, while others are more serious. Any type of abnormal bleeding should be evaluated by your health care provider. Treatment will depend on the cause of the bleeding. HOME CARE INSTRUCTIONS Monitor your condition for any changes. The following actions may help to alleviate any discomfort you are experiencing:  Avoid the use of tampons and douches as directed by your health care provider.  Change your pads frequently. You should get regular pelvic exams and Pap tests. Keep all follow-up appointments for diagnostic tests as directed by your health care provider.  SEEK MEDICAL CARE IF:   Your bleeding lasts more than 1 week.   You feel dizzy at times.  SEEK IMMEDIATE MEDICAL CARE IF:   You pass out.   You are changing pads every 15 to 30 minutes.   You have abdominal pain.  You have a fever.   You become sweaty or weak.   You are passing large blood clots from the vagina.   You start to feel nauseous and vomit. MAKE SURE YOU:   Understand these instructions.  Will watch your condition.  Will get help right away if you are not doing well or get worse. Document Released: 03/03/2005 Document Revised: 03/08/2013 Document Reviewed: 09/30/2012 Exit[employer] Patient Information 2015 [employer]. This information is not intended to replace advice given to you by your health care provider. Make sure you discuss any questions you have with your  health care provider.  

## 2014-11-19 NOTE — MAU Note (Signed)
Pt states here for vaginal bleeding. Has brain aneurysm and is taking blood thinner as well as ASA. Began menstrual cycle yesterday and is currently changing pad at least every hour. Passed 2 large clots in shower. Hx anemia and fibroids.

## 2014-11-22 ENCOUNTER — Other Ambulatory Visit (HOSPITAL_COMMUNITY): Payer: Self-pay | Admitting: Interventional Radiology

## 2014-11-22 ENCOUNTER — Telehealth (HOSPITAL_COMMUNITY): Payer: Self-pay | Admitting: *Deleted

## 2014-11-22 DIAGNOSIS — I671 Cerebral aneurysm, nonruptured: Secondary | ICD-10-CM

## 2014-11-22 NOTE — Telephone Encounter (Signed)
Called and left message for [employer].  Instructed pt that Dr. Estanislado Pandy had review her labs and would like for her to take 1/2 tablet of Plavix (37.5mg ) and to continue her ASA 325mg  daily.  Left call back number for pt if she had questions or concerns

## 2014-11-23 ENCOUNTER — Telehealth (HOSPITAL_COMMUNITY): Payer: Self-pay | Admitting: *Deleted

## 2014-11-23 NOTE — Telephone Encounter (Signed)
Returned call from Ms. Snell, confirmed that patient understood to take half a Plavix and to continue ASA 325mg , she also was concerned about HGB encouraged her to call her primary MD

## 2014-12-01 ENCOUNTER — Other Ambulatory Visit: Payer: Self-pay | Admitting: Radiology

## 2014-12-07 ENCOUNTER — Encounter: Payer: Self-pay | Admitting: Family Medicine

## 2014-12-07 ENCOUNTER — Ambulatory Visit (INDEPENDENT_AMBULATORY_CARE_PROVIDER_SITE_OTHER): Payer: Self-pay | Admitting: Family Medicine

## 2014-12-07 VITALS — BP 123/80 | HR 67 | Temp 99.2°F | Ht 60.0 in | Wt 181.2 lb

## 2014-12-07 DIAGNOSIS — N920 Excessive and frequent menstruation with regular cycle: Secondary | ICD-10-CM | POA: Insufficient documentation

## 2014-12-07 LAB — CBC WITH DIFFERENTIAL/PLATELET
BASOS PCT: 0 % (ref 0–1)
Basophils Absolute: 0 10*3/uL (ref 0.0–0.1)
EOS ABS: 0.1 10*3/uL (ref 0.0–0.7)
Eosinophils Relative: 3 % (ref 0–5)
HCT: 29.6 % — ABNORMAL LOW (ref 36.0–46.0)
Hemoglobin: 9.4 g/dL — ABNORMAL LOW (ref 12.0–15.0)
Lymphocytes Relative: 36 % (ref 12–46)
Lymphs Abs: 1.7 10*3/uL (ref 0.7–4.0)
MCH: 24.6 pg — AB (ref 26.0–34.0)
MCHC: 31.8 g/dL (ref 30.0–36.0)
MCV: 77.5 fL — ABNORMAL LOW (ref 78.0–100.0)
MONO ABS: 0.5 10*3/uL (ref 0.1–1.0)
MONOS PCT: 10 % (ref 3–12)
MPV: 9 fL (ref 8.6–12.4)
NEUTROS PCT: 51 % (ref 43–77)
Neutro Abs: 2.4 10*3/uL (ref 1.7–7.7)
PLATELETS: 292 10*3/uL (ref 150–400)
RBC: 3.82 MIL/uL — AB (ref 3.87–5.11)
RDW: 20.4 % — ABNORMAL HIGH (ref 11.5–15.5)
WBC: 4.8 10*3/uL (ref 4.0–10.5)

## 2014-12-07 NOTE — Progress Notes (Signed)
Subjective:   Holly Gray is a 39 y.o. female with a history of brain aneurysm in August 2/2 vascular intervention here for   Chief Complaint  Patient presents with  . Menorrhagia    with clots x 1 "heaviest period of all of them'   #Menorrhagia - one episode of passing large clots (palm size) for which she went to [facility]U - has had heavy periods for years, always regular every 28-30 days, lasting 5-7 days - Known fibroids - Reports that 2-3 weeks before her particularly heavy period she started taking Plavix and ASA 325mg . She reports the bleeding has stopped. She was prescribed megace but did not take because bleeding resolved.  - She denies dizziness, lightheadedness, SOB, syncope   Health Maintenance Due  Topic Date Due  . TETANUS/TDAP  03/17/1992  . PAP SMEAR  03/17/1994  . INFLUENZA VACCINE  10/16/2014   Review of Systems:   Per HPI. All other systems reviewed and are negative expect as in HPI   PMH, PSH, Medications, Allergies, SocialHx and FHx reviewed and updated in EMR- marked as reviewed 12/07/2014   Objective:  BP 123/80 mmHg  Pulse 67  Temp(Src) 99.2 F (37.3 C) (Oral)  Ht 5' (1.524 m)  Wt 181 lb 3.2 oz (82.192 kg)  BMI 35.39 kg/m2  LMP 11/18/2014  Gen:  39 y.o. female in NAD. Speaking in full sentences and is talkative. Good eye contact HEENT: NCAT, MMM, EOMI, PERRL, anicteric sclerae. Pale conjunctiva CV: RRR, no MRG, no JVD Resp: Normal WOB. Non-labored, CTAB, no wheezes noted GI: Soft, NTND, BS present, no guarding or organomegaly GU: No bleeding. ~8 week size uterus, no adnexal enlargement/mass.  Ext: WWP, no edema MSK: Intact gait. Full ROM Neuro: Alert and oriented, speech normal Pysch: Normal mood and affect.       Chemistry      Component Value Date/Time   NA 137 10/31/2014 0545   K 3.6 10/31/2014 0545   CL 108 10/31/2014 0545   CO2 24 10/31/2014 0545   BUN <5* 10/31/2014 0545   CREATININE 0.51 10/31/2014 0545      Component Value  Date/Time   CALCIUM 8.0* 10/31/2014 0545   ALKPHOS 51 09/25/2014 1127   AST 27 09/25/2014 1127   ALT 26 09/25/2014 1127   BILITOT 0.3 09/25/2014 1127     US Pelvic 11/19/2014 IMPRESSION: Endometrium: Thickness: 4.4 mm. No focal abnormality visualized. The fundal portion is not well visualized 1. Diffusely enlarged and heterogeneous uterus, suggesting the possibility of adenomyosis. 2. Interval decrease in size of 2 small, probable cystic areas within the myometrium with interval lack of visualization of a previously seen 1.5 cm oval hypoechoic area. 3. Normal appearing ovaries.  Assessment:     Holly Gray is a 39 y.o. 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   Plan:   #Menorrhagia: likely normal variant as she has regular heavy periods. She did not use the megace prescribed in MAU 2/2 to to concerns about hormones in the setting of recent vascular procedure.  Her TVUS showed a normal stripe (4.68mm) with possible adenomyosis.  - Recommended pap  (given information) - Discussed progesterone IUD (Mirena/Liletta)as this would help her menorrhagia and would not involve systemic hormones. The patient has another vascular procedure and would like to do this after that. Discussed performing here if her medicaid is approved vs [facility]  - CBC to assess hgb status -  Continue Fe supplement  Caren Macadam, MD, AB[facility] OB Fellow, [facility] 12/07/2014  3:36 PM

## 2014-12-07 NOTE — Patient Instructions (Addendum)
For the IUD When your medicaid is approved you can get an IUD here  If you are uninsured you can get an IUD for free through the [facility] Department  Menorrhagia Menorrhagia is the medical term for when your menstrual periods are heavy or last longer than usual. With menorrhagia, every period you have may cause enough blood loss and cramping that you are unable to maintain your usual activities. CAUSES  In some cases, the cause of heavy periods is unknown, but a number of conditions may cause menorrhagia. Common causes include:  A problem with the hormone-producing thyroid gland (hypothyroid).  Noncancerous growths in the uterus (polyps or fibroids).  An imbalance of the estrogen and progesterone hormones.  One of your ovaries not releasing an egg during one or more months.  Side effects of having an intrauterine device (IUD).  Side effects of some medicines, such as anti-inflammatory medicines or blood thinners.  A bleeding disorder that stops your blood from clotting normally. SIGNS AND SYMPTOMS  During a normal period, bleeding lasts between 4 and 8 days. Signs that your periods are too heavy include:  You routinely have to change your pad or tampon every 1 or 2 hours because it is completely soaked.  You pass blood clots larger than 1 inch (2.5 cm) in size.  You have bleeding for more than 7 days.  You need to use pads and tampons at the same time because of heavy bleeding.  You need to wake up to change your pads or tampons during the night.  You have symptoms of anemia, such as tiredness, fatigue, or shortness of breath. DIAGNOSIS  Your health care provider will perform a physical exam and ask you questions about your symptoms and menstrual history. Other tests may be ordered based on what the health care provider finds during the exam. These tests can include:  Blood tests. Blood tests are used to check if you are pregnant or have hormonal changes, a  bleeding or thyroid disorder, low iron levels (anemia), or other problems.  Endometrial biopsy. Your health care provider takes a sample of tissue from the inside of your uterus to be examined under a microscope.  Pelvic ultrasound. This test uses sound waves to make a picture of your uterus, ovaries, and vagina. The pictures can show if you have fibroids or other growths.  Hysteroscopy. For this test, your health care provider will use a small telescope to look inside your uterus. Based on the results of your initial tests, your health care provider may recommend further testing. TREATMENT  Treatment may not be needed. If it is needed, your health care provider may recommend treatment with one or more medicines first. If these do not reduce bleeding enough, a surgical treatment might be an option. The best treatment for you will depend on:   Whether you need to prevent pregnancy.  Your desire to have children in the future.  The cause and severity of your bleeding.  Your opinion and personal preference.  Medicines for menorrhagia may include:  Birth control methods that use hormones. These include the pill, skin patch, vaginal ring, shots that you get every 3 months, hormonal IUD, and implant. These treatments reduce bleeding during your menstrual period.  Medicines that thicken blood and slow bleeding.  Medicines that reduce swelling, such as ibuprofen.  Medicines that contain a synthetic hormone called progestin.   Medicines that make the ovaries stop working for a short time.  You may need surgical treatment  for menorrhagia if the medicines are unsuccessful. Treatment options include:  Dilation and curettage (D&C). In this procedure, your health care provider opens (dilates) your cervix and then scrapes or suctions tissue from the lining of your uterus to reduce menstrual bleeding.  Operative hysteroscopy. This procedure uses a tiny tube with a light (hysteroscope) to view  your uterine cavity and can help in the surgical removal of a polyp that may be causing heavy periods.  Endometrial ablation. Through various techniques, your health care provider permanently destroys the entire lining of your uterus (endometrium). After endometrial ablation, most women have little or no menstrual flow. Endometrial ablation reduces your ability to become pregnant.  Endometrial resection. This surgical procedure uses an electrosurgical wire loop to remove the lining of the uterus. This procedure also reduces your ability to become pregnant.  Hysterectomy. Surgical removal of the uterus and cervix is a permanent procedure that stops menstrual periods. Pregnancy is not possible after a hysterectomy. This procedure requires anesthesia and hospitalization. HOME CARE INSTRUCTIONS   Only take over-the-counter or prescription medicines as directed by your health care provider. Take prescribed medicines exactly as directed. Do not change or switch medicines without consulting your health care provider.  Take any prescribed iron pills exactly as directed by your health care provider. Long-term heavy bleeding may result in low iron levels. Iron pills help replace the iron your body lost from heavy bleeding. Iron may cause constipation. If this becomes a problem, increase the bran, fruits, and roughage in your diet.  Do not take aspirin or medicines that contain aspirin 1 week before or during your menstrual period. Aspirin may make the bleeding worse.  If you need to change your sanitary pad or tampon more than once every 2 hours, stay in bed and rest as much as possible until the bleeding stops.  Eat well-balanced meals. Eat foods high in iron. Examples are leafy green vegetables, meat, liver, eggs, and whole grain breads and cereals. Do not try to lose weight until the abnormal bleeding has stopped and your blood iron level is back to normal. SEEK MEDICAL CARE IF:   You soak through a pad  or tampon every 1 or 2 hours, and this happens every time you have a period.  You need to use pads and tampons at the same time because you are bleeding so much.  You need to change your pad or tampon during the night.  You have a period that lasts for more than 8 days.  You pass clots bigger than 1 inch wide.  You have irregular periods that happen more or less often than once a month.  You feel dizzy or faint.  You feel very weak or tired.  You feel short of breath or feel your heart is beating too fast when you exercise.  You have nausea and vomiting or diarrhea while you are taking your medicine.  You have any problems that may be related to the medicine you are taking. SEEK IMMEDIATE MEDICAL CARE IF:   You soak through 4 or more pads or tampons in 2 hours.  You have any bleeding while you are pregnant. MAKE SURE YOU:   Understand these instructions.  Will watch your condition.  Will get help right away if you are not doing well or get worse. Document Released: 03/03/2005 Document Revised: 03/08/2013 Document Reviewed: 08/22/2012 [facility] Patient Information 2015 [place], [place]. This information is not intended to replace advice given to you by your health care provider. Make  sure you discuss any questions you have with your health care provider.  Intrauterine Device Insertion Most often, an intrauterine device (IUD) is inserted into the uterus to prevent pregnancy. There are 2 types of IUDs available:  Copper IUD--This type of IUD creates an environment that is not favorable to sperm survival. The mechanism of action of the copper IUD is not known for certain. It can stay in place for 10 years.  Hormone IUD--This type of IUD contains the hormone progestin (synthetic progesterone). The progestin thickens the cervical mucus and prevents sperm from entering the uterus, and it also thins the uterine lining. There is no evidence that the hormone IUD prevents implantation.  One hormone IUD can stay in place for up to 5 years, and a different hormone IUD can stay in place for up to 3 years. An IUD is the most cost-effective birth control if left in place for the full duration. It may be removed at any time. LET [facility] CARE PROVIDER KNOW ABOUT:  Any allergies you have.  All medicines you are taking, including vitamins, herbs, eye drops, creams, and over-the-counter medicines.  Previous problems you or members of your family have had with the use of anesthetics.  Any blood disorders you have.  Previous surgeries you have had.  Possibility of pregnancy.  Medical conditions you have. RISKS AND COMPLICATIONS  Generally, intrauterine device insertion is a safe procedure. However, as with any procedure, complications can occur. Possible complications include:  Accidental puncture (perforation) of the uterus.  Accidental placement of the IUD either in the muscle layer of the uterus (myometrium) or outside the uterus. If this happens, the IUD can be found essentially floating around the bowels and must be taken out surgically.  The IUD may fall out of the uterus (expulsion). This is more common in women who have recently had a child.   Pregnancy in the fallopian tube (ectopic).  Pelvic inflammatory disease (PID), which is infection of the uterus and fallopian tubes. The risk of PID is slightly increased in the first 20 days after the IUD is placed, but the overall risk is still very low. BEFORE THE PROCEDURE  Schedule the IUD insertion for when you will have your menstrual period or right after, to make sure you are not pregnant. Placement of the IUD is better tolerated shortly after a menstrual cycle.  You may need to take tests or be examined to make sure you are not pregnant.  You may be required to take a pregnancy test.  You may be required to get checked for sexually transmitted infections (STIs) prior to placement. Placing an IUD in someone who  has an infection can make the infection worse.  You may be given a pain reliever to take 1 or 2 hours before the procedure.  An exam will be performed to determine the size and position of your uterus.  Ask your health care provider about changing or stopping your regular medicines. PROCEDURE   A tool (speculum) is placed in the vagina. This allows your health care provider to see the lower part of the uterus (cervix).  The cervix is prepped with a medicine that lowers the risk of infection.  You may be given a medicine to numb each side of the cervix (intracervical or paracervical block). This is used to block and control any discomfort with insertion.  A tool (uterine sound) is inserted into the uterus to determine the length of the uterine cavity and the direction the uterus  may be tilted.  A slim instrument (IUD inserter) is inserted through the cervical canal and into your uterus.  The IUD is placed in the uterine cavity and the insertion device is removed.  The nylon string that is attached to the IUD and used for eventual IUD removal is trimmed. It is trimmed so that it lays high in the vagina, just outside the cervix. AFTER THE PROCEDURE  You may have bleeding after the procedure. This is normal. It varies from light spotting for a few days to menstrual-like bleeding.  You may have mild cramping. Document Released: 10/30/2010 Document Revised: 12/22/2012 Document Reviewed: 08/22/2012 [facility] Patient Information 2015 [place], [place]. This information is not intended to replace advice given to you by your health care provider. Make sure you discuss any questions you have with your health care provider.

## 2014-12-12 ENCOUNTER — Telehealth: Payer: Self-pay | Admitting: General Practice

## 2014-12-12 NOTE — Telephone Encounter (Signed)
Per Dr Ernestina Patches, patient's blood counts have improved but still needs to continue iron. Also recommended that patient come to clinic or [facility] for IUD placement. Called patient and informed her of results and recommendations. Patient verbalized understanding and states she is working on getting her medicaid now and will return for IUD. Patient had no questions

## 2014-12-20 ENCOUNTER — Encounter (HOSPITAL_COMMUNITY): Payer: Self-pay

## 2014-12-20 ENCOUNTER — Other Ambulatory Visit (HOSPITAL_COMMUNITY): Payer: Self-pay | Admitting: *Deleted

## 2014-12-20 ENCOUNTER — Inpatient Hospital Stay (HOSPITAL_COMMUNITY)
Admission: RE | Admit: 2014-12-20 | Discharge: 2014-12-20 | Disposition: A | Discharge status: Home / Self Care | Payer: Self-pay | Source: Ambulatory Visit

## 2014-12-20 NOTE — Pre-Procedure Instructions (Signed)
    Holly Gray  12/20/2014      [facility] [place], [place] - [address] [place] [address] Phone: [phone] Fax: [phone]    Your procedure is scheduled on Monday, December 25, 2014 at 8:30 AM.  Report to [facility] Entrance "A" Admitting Office at 6:30 AM.  Call this number if you have problems the morning of surgery: [phone]  Any questions prior to day of surgery, please call [phone] between 8 & 4 PM.   Remember:  Do not eat food or drink liquids after midnight Sunday, 12/24/14.  Take these medicines the morning of surgery with A SIP OF WATER: Aspirin, Plavix, Tylenol - if needed, eye drops - if needed   Do not wear jewelry, make-up or nail polish.  Do not wear lotions, powders, or perfumes.  You may wear deodorant.  Do not shave 48 hours prior to surgery.    Do not bring valuables to the hospital.  [facility] is not responsible for any belongings or valuables.  Contacts, dentures or bridgework may not be worn into surgery.  Leave your suitcase in the car.  After surgery it may be brought to your room.  For patients admitted to the hospital, discharge time will be determined by your treatment team.  Special instructions:  See "Preparing for Surgery" Instruction sheet.  Please read over the following fact sheets that you were given. Pain Booklet, Coughing and Deep Breathing and Surgical Site Infection Prevention

## 2014-12-22 ENCOUNTER — Other Ambulatory Visit: Payer: Self-pay | Admitting: Radiology

## 2014-12-25 ENCOUNTER — Ambulatory Visit (HOSPITAL_COMMUNITY): Admission: RE | Admit: 2014-12-25 | Payer: MEDICAID | Source: Ambulatory Visit

## 2014-12-28 ENCOUNTER — Inpatient Hospital Stay (HOSPITAL_COMMUNITY): Admission: RE | Admit: 2014-12-28 | Payer: Self-pay | Source: Ambulatory Visit

## 2015-01-08 ENCOUNTER — Encounter (HOSPITAL_COMMUNITY)
Admission: RE | Admit: 2015-01-08 | Discharge: 2015-01-08 | Disposition: A | Discharge status: Home / Self Care | Payer: Self-pay | Source: Ambulatory Visit | Attending: Interventional Radiology | Admitting: Interventional Radiology

## 2015-01-08 ENCOUNTER — Encounter (HOSPITAL_COMMUNITY): Payer: Self-pay

## 2015-01-08 DIAGNOSIS — Z7982 Long term (current) use of aspirin: Secondary | ICD-10-CM | POA: Insufficient documentation

## 2015-01-08 DIAGNOSIS — I671 Cerebral aneurysm, nonruptured: Secondary | ICD-10-CM | POA: Insufficient documentation

## 2015-01-08 DIAGNOSIS — Z01818 Encounter for other preprocedural examination: Secondary | ICD-10-CM | POA: Insufficient documentation

## 2015-01-08 DIAGNOSIS — Z7902 Long term (current) use of antithrombotics/antiplatelets: Secondary | ICD-10-CM | POA: Insufficient documentation

## 2015-01-08 DIAGNOSIS — D649 Anemia, unspecified: Secondary | ICD-10-CM | POA: Insufficient documentation

## 2015-01-08 DIAGNOSIS — Z01812 Encounter for preprocedural laboratory examination: Secondary | ICD-10-CM | POA: Insufficient documentation

## 2015-01-08 HISTORY — DX: Anemia, unspecified: D64.9

## 2015-01-08 HISTORY — DX: Cerebral infarction, unspecified: I63.9

## 2015-01-08 LAB — COMPREHENSIVE METABOLIC PANEL
ALT: 17 U/L (ref 14–54)
AST: 18 U/L (ref 15–41)
Albumin: 3.8 g/dL (ref 3.5–5.0)
Alkaline Phosphatase: 52 U/L (ref 38–126)
Anion gap: 8 (ref 5–15)
BILIRUBIN TOTAL: 0.4 mg/dL (ref 0.3–1.2)
BUN: 9 mg/dL (ref 6–20)
CO2: 22 mmol/L (ref 22–32)
CREATININE: 0.56 mg/dL (ref 0.44–1.00)
Calcium: 9.1 mg/dL (ref 8.9–10.3)
Chloride: 109 mmol/L (ref 101–111)
GFR calc Af Amer: >60 mL/min (ref >60)
Glucose, Bld: 86 mg/dL (ref 65–99)
Potassium: 3.7 mmol/L (ref 3.5–5.1)
Sodium: 139 mmol/L (ref 135–145)
TOTAL PROTEIN: 7.3 g/dL (ref 6.5–8.1)

## 2015-01-08 LAB — CBC WITH DIFFERENTIAL/PLATELET
BASOS ABS: 0.1 10*3/uL (ref 0.0–0.1)
Basophils Relative: 1 %
EOS ABS: 0.1 10*3/uL (ref 0.0–0.7)
EOS PCT: 3 %
HEMATOCRIT: 29.5 % — AB (ref 36.0–46.0)
Hemoglobin: 9.3 g/dL — ABNORMAL LOW (ref 12.0–15.0)
Lymphocytes Relative: 37 %
Lymphs Abs: 1.3 10*3/uL (ref 0.7–4.0)
MCH: 25.8 pg — AB (ref 26.0–34.0)
MCHC: 31.5 g/dL (ref 30.0–36.0)
MCV: 81.7 fL (ref 78.0–100.0)
MONO ABS: 0.3 10*3/uL (ref 0.1–1.0)
Monocytes Relative: 9 %
Neutro Abs: 1.7 10*3/uL (ref 1.7–7.7)
Neutrophils Relative %: 50 %
Platelets: 251 10*3/uL (ref 150–400)
RBC: 3.61 MIL/uL — AB (ref 3.87–5.11)
RDW: 17 % — AB (ref 11.5–15.5)
WBC: 3.5 10*3/uL — AB (ref 4.0–10.5)

## 2015-01-08 LAB — PLATELET INHIBITION P2Y12: PLATELET FUNCTION P2Y12: 41 [PRU] — AB (ref 194–418)

## 2015-01-08 LAB — HCG, SERUM, QUALITATIVE: Preg, Serum: NEGATIVE

## 2015-01-08 LAB — PROTIME-INR
INR: 1.08 (ref 0.00–1.49)
Prothrombin Time: 14.2 seconds (ref 11.6–15.2)

## 2015-01-08 LAB — APTT: APTT: 28 s (ref 24–37)

## 2015-01-08 NOTE — Pre-Procedure Instructions (Addendum)
Holly Gray  01/08/2015      [facility] [address], [place] - [address] [place] [address] Phone: [phone] Fax: [phone]    Your procedure is scheduled on Monday, January 15, 2015   Report to [facility] Entrance "A" Admitting Office at 6:30 AM.  Call this number if you have problems the morning of surgery: [phone]  Any questions prior to day of surgery, please call [phone] between 8 & 4 PM.   Remember:  Do not eat food or drink liquids after midnight Sunday, 12/24/14.  Take these medicines the morning of surgery with A SIP OF WATER: Aspirin, Plavix, Tylenol - if needed, eye drops - if needed   Do not wear jewelry, make-up or nail polish.  Do not wear lotions, powders, or perfumes.  You may wear deodorant.  Do not shave 48 hours prior to surgery.    Do not bring valuables to the hospital.  [facility] is not responsible for any belongings or valuables.  Contacts, dentures or bridgework may not be worn into surgery.  Leave your suitcase in the car.  After surgery it may be brought to your room.  For patients admitted to the hospital, discharge time will be determined by your treatment team.  Special instructions:  Special Instructions: [facility] - Preparing for Surgery  Before surgery, you can play an important role.  Because skin is not sterile, your skin needs to be as free of germs as possible.  You can reduce the number of germs on you skin by washing with CHG (chlorahexidine gluconate) soap before surgery.  CHG is an antiseptic cleaner which kills germs and bonds with the skin to continue killing germs even after washing.  Please DO NOT use if you have an allergy to CHG or antibacterial soaps.  If your skin becomes reddened/irritated stop using the CHG and inform your nurse when you arrive at Short Stay.  Do not shave (including legs and underarms) for at least 48 hours prior to the first CHG  shower.  You may shave your face.  Please follow these instructions carefully:   1.  Shower with CHG Soap the night before surgery and the morning of Surgery.  2.  If you choose to wash your hair, wash your hair first as usual with your normal shampoo.  3.  After you shampoo, rinse your hair and body thoroughly to remove the Shampoo.  4.  Use CHG as you would any other liquid soap.  You can apply chg directly  to the skin and wash gently with scrungie or a clean washcloth.  5.  Apply the CHG Soap to your body ONLY FROM THE NECK DOWN.  Do not use on open wounds or open sores.  Avoid contact with your eyes ears, mouth and genitals (private parts).  Wash genitals (private parts)       with your normal soap.  6.  Wash thoroughly, paying special attention to the area where your surgery will be performed.  7.  Thoroughly rinse your body with warm water from the neck down.  8.  DO NOT shower/wash with your normal soap after using and rinsing off the CHG Soap.  9.  Pat yourself dry with a clean towel.            10.  Wear clean pajamas.            11 .  Place clean sheets on your  bed the night of your first shower and do not sleep with pets.  Day of Surgery  Do not apply any lotions/deodorants the morning of surgery.  Please wear clean clothes to the hospital/surgery center..  Please read over the following fact sheets that you were given. Pain Booklet, Coughing and Deep Breathing and Surgical Site Infection Prevention

## 2015-01-09 NOTE — Progress Notes (Signed)
Anesthesia Chart Review:  Pt is 39 year old female scheduled for aneurysm embolization on 01/15/2015 with Dr. Estanislado Pandy.   PMH include: anemia, anxiety. Never smoker. BMI 30. 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  Medications include: tylenol, ferrous sulfate, ASA, plavix.   Preoperative labs reviewed. H/H 9.3/29.5. Pt with hx anemia, currently following with gyn for menorrhagia. Will get T&S DOS.   Chest x-ray 12/24/2013 reviewed. No active cardiopulmonary disease. Mild thoracic dextroscoliosis.   EKG 09/25/2014: NSR.   Exercise stress test 08/01/2008: -No chest pain -No dysrhythmia -No EKG changes of ischemia -EKG and chest pain negative -maximum work load 11.0 METS  Pt with known history of anemia, current H/H consistent with prior results. If no changes, I anticipate pt can proceed with surgery as scheduled.   Willeen Cass, FNP-BC [facility]/Anesthesiology Phone: [phone] 01/09/2015 3:42 PM

## 2015-01-10 ENCOUNTER — Other Ambulatory Visit: Payer: Self-pay | Admitting: Radiology

## 2015-01-11 ENCOUNTER — Telehealth (HOSPITAL_COMMUNITY): Payer: Self-pay | Admitting: Interventional Radiology

## 2015-01-11 NOTE — Telephone Encounter (Signed)
Returned pt's call about needing documentation concerning her past procedure and her upcoming procedure. Told pt to call me back so that I could get her what she needs. JM

## 2015-01-12 ENCOUNTER — Other Ambulatory Visit: Payer: Self-pay | Admitting: Physician Assistant

## 2015-01-12 ENCOUNTER — Other Ambulatory Visit: Payer: Self-pay | Admitting: Radiology

## 2015-01-15 ENCOUNTER — Ambulatory Visit (HOSPITAL_COMMUNITY): Payer: Self-pay | Admitting: Certified Registered Nurse Anesthetist

## 2015-01-15 ENCOUNTER — Encounter (HOSPITAL_COMMUNITY): Payer: Self-pay | Admitting: Certified Registered"

## 2015-01-15 ENCOUNTER — Ambulatory Visit (HOSPITAL_COMMUNITY)
Admission: RE | Admit: 2015-01-15 | Discharge: 2015-01-15 | Disposition: A | Discharge status: Home / Self Care | Payer: Self-pay | Source: Ambulatory Visit | Attending: Interventional Radiology | Admitting: Interventional Radiology

## 2015-01-15 ENCOUNTER — Inpatient Hospital Stay (HOSPITAL_COMMUNITY)
Admission: RE | Admit: 2015-01-15 | Discharge: 2015-01-16 | DRG: 027 | Disposition: A | Discharge status: Home / Self Care | Payer: Self-pay | Source: Ambulatory Visit | Attending: Interventional Radiology | Admitting: Interventional Radiology

## 2015-01-15 ENCOUNTER — Encounter (HOSPITAL_COMMUNITY)
Admission: RE | Disposition: A | Discharge status: Home / Self Care | Payer: Self-pay | Source: Ambulatory Visit | Attending: Interventional Radiology

## 2015-01-15 ENCOUNTER — Encounter (HOSPITAL_COMMUNITY): Payer: Self-pay

## 2015-01-15 DIAGNOSIS — I671 Cerebral aneurysm, nonruptured: Principal | ICD-10-CM | POA: Diagnosis present

## 2015-01-15 DIAGNOSIS — Z7902 Long term (current) use of antithrombotics/antiplatelets: Secondary | ICD-10-CM

## 2015-01-15 DIAGNOSIS — Z8673 Personal history of transient ischemic attack (TIA), and cerebral infarction without residual deficits: Secondary | ICD-10-CM

## 2015-01-15 DIAGNOSIS — D649 Anemia, unspecified: Secondary | ICD-10-CM | POA: Diagnosis present

## 2015-01-15 DIAGNOSIS — Z7982 Long term (current) use of aspirin: Secondary | ICD-10-CM

## 2015-01-15 DIAGNOSIS — F419 Anxiety disorder, unspecified: Secondary | ICD-10-CM | POA: Diagnosis present

## 2015-01-15 DIAGNOSIS — I739 Peripheral vascular disease, unspecified: Secondary | ICD-10-CM | POA: Diagnosis present

## 2015-01-15 DIAGNOSIS — I10 Essential (primary) hypertension: Secondary | ICD-10-CM | POA: Diagnosis present

## 2015-01-15 HISTORY — PX: RADIOLOGY WITH ANESTHESIA: SHX6223

## 2015-01-15 LAB — TYPE AND SCREEN
ABO/RH(D): AB POS
Antibody Screen: NEGATIVE

## 2015-01-15 LAB — POCT ACTIVATED CLOTTING TIME
Activated Clotting Time: 153 seconds
Activated Clotting Time: 159 seconds
Activated Clotting Time: 165 seconds

## 2015-01-15 LAB — HEPARIN LEVEL (UNFRACTIONATED): Heparin Unfractionated: 0.14 IU/mL — ABNORMAL LOW (ref 0.30–0.70)

## 2015-01-15 LAB — PLATELET INHIBITION P2Y12: Platelet Function  P2Y12: 30 [PRU] — ABNORMAL LOW (ref 194–418)

## 2015-01-15 LAB — GLUCOSE, CAPILLARY: GLUCOSE-CAPILLARY: 82 mg/dL (ref 65–99)

## 2015-01-15 SURGERY — RADIOLOGY WITH ANESTHESIA
Anesthesia: Monitor Anesthesia Care

## 2015-01-15 MED ORDER — PROMETHAZINE HCL 25 MG/ML IJ SOLN
INTRAMUSCULAR | Status: AC
  Filled 2015-01-15: qty 1

## 2015-01-15 MED ORDER — ACETAMINOPHEN 325 MG PO TABS
650.0000 mg | ORAL_TABLET | ORAL | Status: AC
  Administered 2015-01-15: 650 mg via ORAL
  Filled 2015-01-15: qty 2

## 2015-01-15 MED ORDER — NIMODIPINE 30 MG PO CAPS
0.0000 mg | ORAL_CAPSULE | ORAL | Status: AC
Start: 2015-01-15 — End: 2015-01-15
  Administered 2015-01-15: 30 mg via ORAL
  Filled 2015-01-15 (×2): qty 2

## 2015-01-15 MED ORDER — FENTANYL CITRATE (PF) 250 MCG/5ML IJ SOLN
INTRAMUSCULAR | Status: DC | PRN
  Administered 2015-01-15: 50 ug via INTRAVENOUS

## 2015-01-15 MED ORDER — ASPIRIN EC 325 MG PO TBEC
325.0000 mg | DELAYED_RELEASE_TABLET | ORAL | Status: DC
  Filled 2015-01-15: qty 1

## 2015-01-15 MED ORDER — SODIUM CHLORIDE 0.9 % IV SOLN: INTRAVENOUS | Status: DC

## 2015-01-15 MED ORDER — CLOPIDOGREL BISULFATE 75 MG PO TABS
75.0000 mg | ORAL_TABLET | ORAL | Status: DC
  Filled 2015-01-15: qty 1

## 2015-01-15 MED ORDER — MEPERIDINE HCL 25 MG/ML IJ SOLN: 6.2500 mg | INTRAMUSCULAR | Status: DC | PRN

## 2015-01-15 MED ORDER — NEOSTIGMINE METHYLSULFATE 10 MG/10ML IV SOLN
INTRAVENOUS | Status: DC | PRN
  Administered 2015-01-15: 3 mg via INTRAVENOUS

## 2015-01-15 MED ORDER — IOHEXOL 300 MG/ML  SOLN
300.0000 mL | Freq: Once | INTRAMUSCULAR | Status: DC | PRN
  Administered 2015-01-15: 150 mL via INTRAVENOUS
  Filled 2015-01-15: qty 300

## 2015-01-15 MED ORDER — LACTATED RINGERS IV SOLN
INTRAVENOUS | Status: DC
  Administered 2015-01-15 (×3): via INTRAVENOUS

## 2015-01-15 MED ORDER — FENTANYL CITRATE (PF) 100 MCG/2ML IJ SOLN
25.0000 ug | INTRAMUSCULAR | Status: DC | PRN
  Administered 2015-01-15 (×4): 25 ug via INTRAVENOUS

## 2015-01-15 MED ORDER — LABETALOL HCL 5 MG/ML IV SOLN
INTRAVENOUS | Status: DC | PRN
  Administered 2015-01-15 (×2): 5 mg via INTRAVENOUS

## 2015-01-15 MED ORDER — ONDANSETRON HCL 4 MG/2ML IJ SOLN
INTRAMUSCULAR | Status: DC | PRN
  Administered 2015-01-15: 4 mg via INTRAVENOUS

## 2015-01-15 MED ORDER — PROMETHAZINE HCL 25 MG/ML IJ SOLN
6.2500 mg | INTRAMUSCULAR | Status: DC | PRN
  Administered 2015-01-15: 6.25 mg via INTRAVENOUS

## 2015-01-15 MED ORDER — NITROGLYCERIN 1 MG/10 ML FOR IR/CATH LAB
INTRA_ARTERIAL | Status: AC
  Filled 2015-01-15: qty 10

## 2015-01-15 MED ORDER — PHENYLEPHRINE HCL 10 MG/ML IJ SOLN
10.0000 mg | INTRAVENOUS | Status: DC | PRN
  Administered 2015-01-15: 15 ug/min via INTRAVENOUS

## 2015-01-15 MED ORDER — VECURONIUM BROMIDE 10 MG IV SOLR
INTRAVENOUS | Status: DC | PRN
  Administered 2015-01-15: 1 mg via INTRAVENOUS
  Administered 2015-01-15: 2 mg via INTRAVENOUS
  Administered 2015-01-15: 1 mg via INTRAVENOUS

## 2015-01-15 MED ORDER — GLYCOPYRROLATE 0.2 MG/ML IJ SOLN
INTRAMUSCULAR | Status: DC | PRN
  Administered 2015-01-15: 0.4 mg via INTRAVENOUS

## 2015-01-15 MED ORDER — HEPARIN (PORCINE) IN NACL 100-0.45 UNIT/ML-% IJ SOLN
500.0000 [IU]/h | INTRAMUSCULAR | Status: AC
  Administered 2015-01-15 (×2): 500 [IU]/h via INTRAVENOUS
  Filled 2015-01-15: qty 250

## 2015-01-15 MED ORDER — FENTANYL CITRATE (PF) 100 MCG/2ML IJ SOLN
INTRAMUSCULAR | Status: AC
  Filled 2015-01-15: qty 2

## 2015-01-15 MED ORDER — ROCURONIUM BROMIDE 100 MG/10ML IV SOLN
INTRAVENOUS | Status: DC | PRN
  Administered 2015-01-15: 40 mg via INTRAVENOUS
  Administered 2015-01-15: 10 mg via INTRAVENOUS

## 2015-01-15 MED ORDER — MIDAZOLAM HCL 5 MG/5ML IJ SOLN
INTRAMUSCULAR | Status: DC | PRN
  Administered 2015-01-15 (×2): 1 mg via INTRAVENOUS

## 2015-01-15 MED ORDER — HEPARIN SODIUM (PORCINE) 1000 UNIT/ML IJ SOLN
INTRAMUSCULAR | Status: DC | PRN
  Administered 2015-01-15 (×2): 500 [IU] via INTRAVENOUS
  Administered 2015-01-15: 3000 [IU] via INTRAVENOUS
  Administered 2015-01-15 (×2): 500 [IU] via INTRAVENOUS

## 2015-01-15 MED ORDER — SODIUM CHLORIDE 0.9 % IV SOLN
INTRAVENOUS | Status: DC
  Administered 2015-01-15: 17:00:00 via INTRAVENOUS

## 2015-01-15 MED ORDER — ACETAMINOPHEN 650 MG RE SUPP: 650.0000 mg | Freq: Four times a day (QID) | RECTAL | Status: DC | PRN

## 2015-01-15 MED ORDER — LIDOCAINE HCL (CARDIAC) 20 MG/ML IV SOLN
INTRAVENOUS | Status: DC | PRN
  Administered 2015-01-15: 100 mg via INTRAVENOUS

## 2015-01-15 MED ORDER — PROPOFOL 10 MG/ML IV BOLUS
INTRAVENOUS | Status: DC | PRN
  Administered 2015-01-15: 40 mg via INTRAVENOUS
  Administered 2015-01-15: 50 mg via INTRAVENOUS
  Administered 2015-01-15: 150 mg via INTRAVENOUS

## 2015-01-15 MED ORDER — HEPARIN (PORCINE) IN NACL 100-0.45 UNIT/ML-% IJ SOLN
INTRAMUSCULAR | Status: AC
Start: 2015-01-15 — End: 2015-01-16
  Filled 2015-01-15: qty 250

## 2015-01-15 MED ORDER — ACETAMINOPHEN 500 MG PO TABS
1000.0000 mg | ORAL_TABLET | Freq: Four times a day (QID) | ORAL | Status: DC | PRN
  Administered 2015-01-16 (×2): 1000 mg via ORAL
  Filled 2015-01-15 (×2): qty 2

## 2015-01-15 MED ORDER — CEFAZOLIN SODIUM-DEXTROSE 2-3 GM-% IV SOLR
2.0000 g | INTRAVENOUS | Status: AC
  Administered 2015-01-15: 2 g via INTRAVENOUS
  Filled 2015-01-15 (×2): qty 50

## 2015-01-15 NOTE — Anesthesia Preprocedure Evaluation (Addendum)
Anesthesia Evaluation  Patient identified by MRN, date of birth, ID band Patient awake    Reviewed: Allergy & Precautions, NPO status , Patient's Chart, lab work & pertinent test results  Airway Mallampati: II       Dental  (+) Teeth Intact   Pulmonary neg pulmonary ROS,    breath sounds clear to auscultation       Cardiovascular hypertension, + Peripheral Vascular Disease   Rhythm:Regular Rate:Normal     Neuro/Psych PSYCHIATRIC DISORDERS Anxiety CVA    GI/Hepatic negative GI ROS, Neg liver ROS,   Endo/Other  negative endocrine ROS  Renal/GU negative Renal ROS     Musculoskeletal   Abdominal   Peds  Hematology negative hematology ROS (+) anemia ,   Anesthesia Other Findings   Reproductive/Obstetrics                            Anesthesia Physical  Anesthesia Plan  ASA: III  Anesthesia Plan: MAC   Post-op Pain Management:    Induction: Intravenous  Airway Management Planned:   Additional Equipment: Arterial line  Intra-op Plan:   Post-operative Plan:   Informed Consent: I have reviewed the patients History and Physical, chart, labs and discussed the procedure including the risks, benefits and alternatives for the proposed anesthesia with the patient or authorized representative who has indicated his/her understanding and acceptance.   Dental advisory given  Plan Discussed with: CRNA and Anesthesiologist  Anesthesia Plan Comments:       Anesthesia Quick Evaluation

## 2015-01-15 NOTE — Anesthesia Postprocedure Evaluation (Signed)
Anesthesia Post Note  Patient: Holly Gray  Procedure(s) Performed: Procedure(s) (LRB): ANEURYSM EMBOLIZATION (N/A)  Anesthesia type: General  Patient location: PACU  Post pain: Pain level controlled  Post assessment: Post-op Vital signs reviewed  Last Vitals: BP 105/61 mmHg  Pulse 60  Temp(Src) 36.3 C (Oral)  Resp 18  Ht 5\' 5"  (1.651 m)  Wt 182 lb 4 oz (82.668 kg)  BMI 30.33 kg/m2  SpO2 100%  LMP 12/18/2014  Post vital signs: Reviewed  Level of consciousness: sedated  Complications: No apparent anesthesia complications

## 2015-01-15 NOTE — H&P (Signed)
Chief Complaint: Patient was seen in consultation today for R ICA periophthalmic artery aneurysm at the request of Alexis Goodell MD  Referring Physician(s): Alexis Goodell  History of Present Illness: Holly Gray is a 39 y.o. 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 Bronchitis     hx of  . Anxiety   . Varicose veins   . Brain aneurysm   . Stroke [facility])     ?   Marland Kitchen Anemia     Past Surgical History  Procedure Laterality Date  . Cyst excision      groin left  . Cesarean section    . Radiology with anesthesia N/A 10/30/2014    Procedure: RADIOLOGY WITH ANESTHESIA;  Surgeon: Luanne Bras, MD;  Location: [facility];  Service: Radiology;  Laterality: N/A;    Allergies: Mango flavor  Medications: Prior to Admission medications   Medication Sig Start Date End Date Taking? Authorizing Provider  acetaminophen (TYLENOL) 325 MG tablet Take 650 mg by mouth 2 (two) times daily as needed (pain).   Yes Historical Provider, MD  aspirin 325 MG tablet Take 325 mg by mouth daily.   Yes Historical Provider, MD  clopidogrel (PLAVIX) 75 MG tablet Take 37.5 mg by mouth daily.    Yes Historical Provider, MD  ferrous sulfate 325 (65 FE) MG tablet Take 325 mg by mouth daily with breakfast.   Yes Historical Provider, MD  Polyethyl Glycol-Propyl Glycol (SYSTANE OP) Place 1 drop into both eyes 2 (two) times daily as needed (dry eyes).   Yes Historical Provider, MD  megestrol (MEGACE) 40 MG tablet Take 1 tablet (40 mg total) by mouth 3 (three) times daily. For  5 days Patient not taking: Reported on 12/07/2014 11/19/14   Paticia Stack, PA-C     Family History  Problem Relation Age of Onset  . Hypertension Mother     Social History   Social History  . Marital Status: Married    Spouse Name: N/A  . Number of Children: N/A  . Years of Education: N/A   Social History Main Topics  . Smoking status: Never Smoker   . Smokeless tobacco: None  . Alcohol Use: No  . Drug Use: No  . Sexual Activity: Yes    Birth Control/ Protection: None   Other Topics Concern  . None   Social History Narrative     Review of Systems: A 12 point ROS discussed and pertinent positives are indicated in the HPI above.  All other systems are negative.  Review of Systems  Constitutional: Negative for activity change and fatigue.  HENT: Negative for tinnitus and voice change.   Eyes: Negative for visual disturbance.  Respiratory: Negative for shortness of breath.   Cardiovascular: Negative for chest pain.  Gastrointestinal: Negative for nausea and vomiting.  Musculoskeletal: Negative for neck pain and neck stiffness.  Neurological: Positive for headaches. Negative for dizziness, tremors, seizures, syncope, facial asymmetry, speech difficulty, weakness, light-headedness and numbness.  Psychiatric/Behavioral: Negative for behavioral problems and confusion.    Vital Signs: LMP 12/18/2014  Physical Exam  Constitutional: She is oriented to person, place, and time. She appears well-nourished.  Eyes: EOM are normal.  Neck: Normal range of motion. Neck supple.  Cardiovascular: Normal rate, regular rhythm and normal heart sounds.   No murmur heard. Pulmonary/Chest: Effort normal and breath sounds normal. She has no wheezes.  Abdominal: Soft. Bowel sounds are normal. There is no tenderness.  Musculoskeletal: Normal range of motion.  Neurological: She is alert and oriented to person, place, and time.  Skin: Skin is warm and dry.  Psychiatric: She has a  normal mood and affect. Her behavior is normal. Judgment and thought content normal.  Nursing note and vitals reviewed.   Mallampati Score:  MD Evaluation Airway: WNL Heart: WNL Abdomen: WNL Chest/ Lungs: WNL ASA  Classification: 2 Mallampati/Airway Score: Two  Imaging: No results found.  Labs:  CBC:  Recent Labs  10/31/14 0545 11/19/14 0951 12/07/14 1558 01/08/15 1030  WBC 4.3 6.1 4.8 3.5*  HGB 7.7* 7.7* 9.4* 9.3*  HCT 24.9* 24.9* 29.6* 29.5*  PLT 255 261 292 251    COAGS:  Recent Labs  09/25/14 1127 10/17/14 1040 10/30/14 2300 01/08/15 1030  INR 1.15 1.11  --  1.08  APTT 27 28 48* 28    BMP:  Recent Labs  09/25/14 1127 09/25/14 1139 10/17/14 1040 10/31/14 0545 01/08/15 1030  NA 135 138 137 137 139  K 3.5 3.5 4.0 3.6 3.7  CL 104 104 108 108 109  CO2 22  --  24 24 22   GLUCOSE 95 94 98 88 86  BUN 12 13 15  <5* 9  CALCIUM 8.9  --  9.0 8.0* 9.1  CREATININE 0.46 0.50 0.60 0.51 0.56  GFRNONAA >60  --  >60 >60 >60  GFRAA >60  --  >60 >60 >60    LIVER FUNCTION TESTS:  Recent Labs  09/25/14 1127 01/08/15 1030  BILITOT 0.3 0.4  AST 27 18  ALT 26 17  ALKPHOS 51 52  PROT 6.8 7.3  ALBUMIN 3.5 3.8    TUMOR MARKERS: No results for input(s): AFPTM, CEA, CA199, CHROMGRNA in the last 8760 hours.  Assessment and Plan:  Hx Headaches that sent pt to ED 09/2014 Found to have B Internal carotid periophthalmic artery aneurysms LICA treated 5/91/6384 with pipeline stent Now scheduled for R ICA treatment Risks and Benefits discussed with the patient including, but not limited to bleeding, infection, vascular injury, contrast induced renal failure, stroke or even death. All of the patient's questions were answered, patient is agreeable to proceed. Consent signed and in chart. Pt is aware she will be admitted overnight if procedure is performed. Plan for discharge following day  Thank you for this interesting consult.  I greatly enjoyed meeting Jarelyn  A Tine and look forward to participating in their care.  A copy of this report was sent to the requesting provider on this date.  Signed: Laurissa Cowper A 01/15/2015, 7:29 AM   I spent a total of  30 Minutes   in face to face in clinical consultation, greater than 50% of which was counseling/coordinating care for R ICA aneurysm embolization

## 2015-01-15 NOTE — Procedures (Signed)
S/P bilateral common carotid arteriograms,followed by placement of  A pipeline device  Pipeline flow diverter across RT para ophthalmic wide neck aneurysm

## 2015-01-15 NOTE — Anesthesia Procedure Notes (Signed)
Procedure Name: Intubation Date/Time: 01/15/2015 9:03 AM Performed by: Julian Reil Pre-anesthesia Checklist: Patient identified, Emergency Drugs available, Suction available and Patient being monitored Patient Re-evaluated:Patient Re-evaluated prior to inductionOxygen Delivery Method: Circle system utilized Preoxygenation: Pre-oxygenation with 100% oxygen Intubation Type: IV induction Ventilation: Mask ventilation without difficulty Laryngoscope Size: Mac and 3 Grade View: Grade II Tube type: Oral Tube size: 7.0 mm Number of attempts: 1 Airway Equipment and Method: Stylet Placement Confirmation: ETT inserted through vocal cords under direct vision,  positive ETCO2 and breath sounds checked- equal and bilateral Secured at: 21 cm Tube secured with: Tape Dental Injury: Teeth and Oropharynx as per pre-operative assessment

## 2015-01-15 NOTE — Transfer of Care (Signed)
Immediate Anesthesia Transfer of Care Note  Patient: Holly Gray  Procedure(s) Performed: Procedure(s): ANEURYSM EMBOLIZATION (N/A)  Patient Location: PACU  Anesthesia Type:General  Level of Consciousness: awake, alert , oriented and patient cooperative  Airway & Oxygen Therapy: Patient Spontanous Breathing and Patient connected to nasal cannula oxygen  Post-op Assessment: Report given to RN, Post -op Vital signs reviewed and stable and Patient moving all extremities  Post vital signs: Reviewed and stable  Last Vitals:  Filed Vitals:   01/15/15 0837  BP: 145/91  Pulse:   Temp:   Resp:     Complications: No apparent anesthesia complications

## 2015-01-15 NOTE — Progress Notes (Signed)
Referring Physician(s): Dr Doy Mince  Chief Complaint:  R periophthalmic artery aneurysm pipeline embolization performed in IR 10/31  Subjective: Pt has done well In PACU still Headache Denies N/V Speech no issues  Allergies: Mango flavor  Medications: Prior to Admission medications   Medication Sig Start Date End Date Taking? Authorizing Provider  aspirin 325 MG tablet Take 325 mg by mouth daily.   Yes Historical Provider, MD  clopidogrel (PLAVIX) 75 MG tablet Take 37.5 mg by mouth daily.    Yes Historical Provider, MD  ferrous sulfate 325 (65 FE) MG tablet Take 325 mg by mouth daily with breakfast.   Yes Historical Provider, MD  Polyethyl Glycol-Propyl Glycol (SYSTANE OP) Place 1 drop into both eyes 2 (two) times daily as needed (dry eyes).   Yes Historical Provider, MD  acetaminophen (TYLENOL) 325 MG tablet Take 650 mg by mouth 2 (two) times daily as needed (pain).    Historical Provider, MD  megestrol (MEGACE) 40 MG tablet Take 1 tablet (40 mg total) by mouth 3 (three) times daily. For 5 days Patient not taking: Reported on 12/07/2014 11/19/14   Paticia Stack, PA-C     Vital Signs: BP 105/61 mmHg  Pulse 61  Temp(Src) 97.3 F (36.3 C) (Oral)  Resp 16  Ht 5\' 5"  (1.651 m)  Wt 182 lb 4 oz (82.668 kg)  BMI 30.33 kg/m2  SpO2 100%  LMP 12/18/2014  Physical Exam  Constitutional: She is oriented to person, place, and time.  Eyes: EOM are normal.  Neck: Normal range of motion.  Pulmonary/Chest: Effort normal.  Abdominal: Soft. Bowel sounds are normal.  Musculoskeletal: Normal range of motion.  Neurological: She is alert and oriented to person, place, and time.  Skin: Skin is warm and dry.  Rt groin NT No bleeding No hematoma 2+ pulses-Rt foot  Psychiatric: She has a normal mood and affect. Her behavior is normal. Judgment and thought content normal.  Nursing note and vitals reviewed.   Imaging: No results found.  Labs:  CBC:  Recent Labs   10/31/14 0545 11/19/14 0951 12/07/14 1558 01/08/15 1030  WBC 4.3 6.1 4.8 3.5*  HGB 7.7* 7.7* 9.4* 9.3*  HCT 24.9* 24.9* 29.6* 29.5*  PLT 255 261 292 251    COAGS:  Recent Labs  09/25/14 1127 10/17/14 1040 10/30/14 2300 01/08/15 1030  INR 1.15 1.11  --  1.08  APTT 27 28 48* 28    BMP:  Recent Labs  09/25/14 1127 09/25/14 1139 10/17/14 1040 10/31/14 0545 01/08/15 1030  NA 135 138 137 137 139  K 3.5 3.5 4.0 3.6 3.7  CL 104 104 108 108 109  CO2 22  --  24 24 22   GLUCOSE 95 94 98 88 86  BUN 12 13 15  <5* 9  CALCIUM 8.9  --  9.0 8.0* 9.1  CREATININE 0.46 0.50 0.60 0.51 0.56  GFRNONAA >60  --  >60 >60 >60  GFRAA >60  --  >60 >60 >60    LIVER FUNCTION TESTS:  Recent Labs  09/25/14 1127 01/08/15 1030  BILITOT 0.3 0.4  AST 27 18  ALT 26 17  ALKPHOS 51 52  PROT 6.8 7.3  ALBUMIN 3.5 3.8    Assessment and Plan:  R ICA- periophthalmic artery aneurysm pipeline stent placed 10/31 with Dr Estanislado Pandy Plan for admission to Neuro ICU Overnight stay Dr Estanislado Pandy has seen and examined pt  Signed: Giovani Neumeister A 01/15/2015, 3:33 PM   I spent a total of 15  at the the patient's bedside AND on the patient's hospital floor or unit, greater than 50% of which was counseling/coordinating care for cerebral aneurysm

## 2015-01-15 NOTE — Progress Notes (Addendum)
ANTICOAGULATION CONSULT NOTE - Initial Consult  Pharmacy Consult for Heparin  Indication: Post embolization   Allergies  Allergen Reactions  . Mango Flavor Itching    Patient Measurements: Height: 5\' 5"  (165.1 cm) Weight: 182 lb 4 oz (82.668 kg) IBW/kg (Calculated) : 57 Heparin Dosing Weight: 74 kg   Vital Signs: Temp: 98.3 F (36.8 C) (10/31 0707) Temp Source: Oral (10/31 0707) BP: 145/91 mmHg (10/31 0837) Pulse Rate: 91 (10/31 0707)  Labs:  Recent Labs  01/15/15 0715  HEPARINUNFRC <0.10*    Estimated Creatinine Clearance: 98.3 mL/min (by C-G formula based on Cr of 0.56).   Medical History: Past Medical History  Diagnosis Date  . Bronchitis     hx of  . Anxiety   . Varicose veins   . Brain aneurysm   . Stroke [facility])     ?   Marland Kitchen Anemia     Medications:  Prescriptions prior to admission  Medication Sig Dispense Refill Last Dose  . aspirin 325 MG tablet Take 325 mg by mouth daily.   01/15/2015 at Unknown time  . clopidogrel (PLAVIX) 75 MG tablet Take 37.5 mg by mouth daily.    01/15/2015 at Unknown time  . ferrous sulfate 325 (65 FE) MG tablet Take 325 mg by mouth daily with breakfast.   01/14/2015 at Unknown time  . Polyethyl Glycol-Propyl Glycol (SYSTANE OP) Place 1 drop into both eyes 2 (two) times daily as needed (dry eyes).   01/14/2015 at Unknown time  . acetaminophen (TYLENOL) 325 MG tablet Take 650 mg by mouth 2 (two) times daily as needed (pain).   More than a month at Unknown time  . megestrol (MEGACE) 40 MG tablet Take 1 tablet (40 mg total) by mouth 3 (three) times daily. For 5 days (Patient not taking: Reported on 12/07/2014) 45 tablet 0 Not Taking at Unknown time    Assessment: 26 YOF with periopthalmic artery aneurysm. She is s/p bilateral common carotid arteriograms followed by a placement of pipeline flow diverters. Pharmacy consulted to start IV heparin post embolization. H/H low but stable, Plt wnl.   Goal of Therapy:  Heparin level 0.1-0.25  units/ml Monitor platelets by anticoagulation protocol: Yes   Plan:  -Heparin infusion started at 500 units/hr per MD. No bolus. -F/u 6 hr HL -Monitor daily HL, CBC and s/s of bleeding  Albertina Parr, PharmD., BCPS Clinical Pharmacist Phone [phone]   ADDN: F/u HL is therapeutic at 0.14 on heparin 500 units/hr  Plan: Continue heparin 500 units/hr Daily HL Heparin to be stopped 11/1 at [facility]. Diona Foley, PharmD Clinical Pharmacist Pager [phone]

## 2015-01-16 ENCOUNTER — Other Ambulatory Visit: Payer: Self-pay | Admitting: Radiology

## 2015-01-16 ENCOUNTER — Encounter (HOSPITAL_COMMUNITY): Payer: Self-pay | Admitting: Interventional Radiology

## 2015-01-16 DIAGNOSIS — I729 Aneurysm of unspecified site: Secondary | ICD-10-CM

## 2015-01-16 LAB — CBC
HEMATOCRIT: 25.2 % — AB (ref 36.0–46.0)
Hemoglobin: 7.8 g/dL — ABNORMAL LOW (ref 12.0–15.0)
MCH: 25.7 pg — ABNORMAL LOW (ref 26.0–34.0)
MCHC: 31 g/dL (ref 30.0–36.0)
MCV: 83.2 fL (ref 78.0–100.0)
PLATELETS: 174 10*3/uL (ref 150–400)
RBC: 3.03 MIL/uL — ABNORMAL LOW (ref 3.87–5.11)
RDW: 17 % — AB (ref 11.5–15.5)
WBC: 3.6 10*3/uL — AB (ref 4.0–10.5)

## 2015-01-16 LAB — BASIC METABOLIC PANEL
ANION GAP: 5 (ref 5–15)
BUN: <5 mg/dL — ABNORMAL LOW (ref 6–20)
CALCIUM: 8.1 mg/dL — AB (ref 8.9–10.3)
CHLORIDE: 106 mmol/L (ref 101–111)
CO2: 24 mmol/L (ref 22–32)
CREATININE: 0.53 mg/dL (ref 0.44–1.00)
GFR calc Af Amer: >60 mL/min (ref >60)
GLUCOSE: 96 mg/dL (ref 65–99)
POTASSIUM: 3.6 mmol/L (ref 3.5–5.1)
Sodium: 135 mmol/L (ref 135–145)

## 2015-01-16 LAB — GLUCOSE, CAPILLARY
Glucose-Capillary: 79 mg/dL (ref 65–99)
Glucose-Capillary: 84 mg/dL (ref 65–99)
Glucose-Capillary: 85 mg/dL (ref 65–99)
Glucose-Capillary: 96 mg/dL (ref 65–99)

## 2015-01-16 LAB — PLATELET INHIBITION P2Y12: Platelet Function  P2Y12: 108 [PRU] — ABNORMAL LOW (ref 194–418)

## 2015-01-16 LAB — HEPARIN LEVEL (UNFRACTIONATED)

## 2015-01-16 MED ORDER — ONDANSETRON HCL 4 MG/2ML IJ SOLN: 4.0000 mg | Freq: Four times a day (QID) | INTRAMUSCULAR | Status: DC | PRN

## 2015-01-16 MED ORDER — ASPIRIN 325 MG PO TABS
325.0000 mg | ORAL_TABLET | Freq: Every day | ORAL | Status: DC
  Administered 2015-01-16: 325 mg via ORAL
  Filled 2015-01-16: qty 1

## 2015-01-16 MED ORDER — CLOPIDOGREL BISULFATE 75 MG PO TABS: 75.0000 mg | ORAL_TABLET | Freq: Every day | ORAL | Status: DC

## 2015-01-16 MED ORDER — CLOPIDOGREL BISULFATE 75 MG PO TABS
75.0000 mg | ORAL_TABLET | Freq: Every day | ORAL | Status: DC
  Administered 2015-01-16: 75 mg via ORAL
  Filled 2015-01-16: qty 1

## 2015-01-16 MED ORDER — NICARDIPINE HCL IN NACL 20-0.86 MG/200ML-% IV SOLN: 5.0000 mg/h | INTRAVENOUS | Status: DC

## 2015-01-16 NOTE — Discharge Summary (Signed)
Patient ID: Holly Gray MRN: [id number] DOB/AGE: [birth year] 41 y.o.  Admit date: 01/15/2015 Discharge date: 01/16/2015  Admission Diagnoses: RT para ophthalmic wide neck aneurysm  Discharge Diagnoses:  Active Problems:   Brain aneurysm S/P bilateral common carotid arteriogram followed by placement ofa pipeline device.Pipeline flow diverter across RT para ophthalmic wide neck aneurysm  Discharged Condition: Good, stable.  Hospital Course: Holly Gray is a 39 y.o. female who was seen in ED 09/2014 with headache and Rt arm weakness work up incidentally revealed Bilateral Internal carotid periophthalmic artery aneurysms. She was referred to Dr. Estanislado Pandy and is s/p Left ICA periophthalmic artery aneurysm embolization with pipeline stent on 10/30/2014. She was scheduled 01/15/15 to undergo intervention of her right sided aneurysm. She is S/P bilateral common carotid arteriogram followed by placement ofa pipeline device.Pipeline flow diverter across RT para ophthalmic wide neck aneurysm.   She was admitted to the Neuro ICU and extubated successfully. Her vitals and labs have remained stable and she is ready for discharge home today. She denies any chest pain or shortness of breath. She denies any extremity weakness, vision, speech or hearing changes. She denies any right groin access site pain or bleeding. She has urinated on her own without difficulty. She has tolerated a regular diet without nausea or vomiting. She has ambulated well without lightheadedness or dizziness. She has been seen by Dr. Estanislado Pandy and myself and deemed stable for discharge home. She had a p2y12 today that was 108 and Dr. Estanislado Pandy recommended she take ASA 325 mg and Plavix 75 mg daily (increased from her previous 1/2 tablet of plavix daily) She was given the below discharge instructions and verbalized understanding.    Consults: Anesthesia.   Treatments:   Discharge Exam: Blood pressure 112/70, pulse 61,  temperature 98.4 F (36.9 C), temperature source Oral, resp. rate 20, height 5\' 5"  (1.651 m), weight 182 lb 4 oz (82.668 kg), last menstrual period 12/18/2014, SpO2 100 %.  Physical Exam:  General: A&Ox3, NAD Heart: RRR Lungs: CTA b/l Abd: Soft, NT, ND, (+) BS Ext: RCFA dressing C/D/I, soft, NT, no signs of bleeding or hematoma, DP intact b/l, warm b/l, no edema b/l Neuro: Grossly intact, speech clear, smile symmetrical, tongue midline, equal strength upper and lower extremities b/l, no ataxia, fine motor intact, EOMI  Disposition: 01-Home or Self Care  Discharge Instructions    Call MD for:  difficulty breathing, headache or visual disturbances    Complete by:  As directed      Call MD for:  extreme fatigue    Complete by:  As directed      Call MD for:  hives    Complete by:  As directed      Call MD for:  persistant dizziness or light-headedness    Complete by:  As directed      Call MD for:  persistant nausea and vomiting    Complete by:  As directed      Call MD for:  redness, tenderness, or signs of infection (pain, swelling, redness, odor or green/yellow discharge around incision site)    Complete by:  As directed      Call MD for:  severe uncontrolled pain    Complete by:  As directed      Call MD for:  temperature >100.4    Complete by:  As directed      Diet - low sodium heart healthy    Complete by:  As directed  Driving Restrictions    Complete by:  As directed   No driving x 2 weeks     Increase activity slowly    Complete by:  As directed      Lifting restrictions    Complete by:  As directed   No lifting over 20 lbs, no bending or stooping for extended periods x 2 weeks            Medication List    TAKE these medications        acetaminophen 325 MG tablet  Commonly known as:  TYLENOL  Take 650 mg by mouth 2 (two) times daily as needed (pain).     aspirin 325 MG tablet  Take 325 mg by mouth daily.     clopidogrel 75 MG tablet  Commonly known  as:  PLAVIX  Take 1 tablet (75 mg total) by mouth daily.     ferrous sulfate 325 (65 FE) MG tablet  Take 325 mg by mouth daily with breakfast.     megestrol 40 MG tablet  Commonly known as:  MEGACE  Take 1 tablet (40 mg total) by mouth 3 (three) times daily. For 5 days     SYSTANE OP  Place 1 drop into both eyes 2 (two) times daily as needed (dry eyes).           Follow-up Information    Follow up with DEVESHWAR, Fritz Pickerel, MD In 2 weeks.   Specialty:  Interventional Radiology   Why:  Our office will call patient for appointment [phone]   Contact information:   [address] [place] [address] [phone]        Signed: Hedy Jacob 01/16/2015, 12:38 PM   I have spent Greater Than 30 Minutes discharging [place].

## 2015-01-16 NOTE — Progress Notes (Signed)
Discussed discharge instructions and paperwork with patient and husband at bedside. Verbalized understand of all information.

## 2015-01-17 ENCOUNTER — Emergency Department (HOSPITAL_COMMUNITY)
Admission: EM | Admit: 2015-01-17 | Discharge: 2015-01-17 | Disposition: A | Discharge status: Home / Self Care | Payer: Self-pay | Attending: Emergency Medicine | Admitting: Emergency Medicine

## 2015-01-17 ENCOUNTER — Encounter (HOSPITAL_COMMUNITY): Payer: Self-pay | Admitting: *Deleted

## 2015-01-17 ENCOUNTER — Emergency Department (HOSPITAL_COMMUNITY): Payer: Self-pay

## 2015-01-17 DIAGNOSIS — Z8673 Personal history of transient ischemic attack (TIA), and cerebral infarction without residual deficits: Secondary | ICD-10-CM | POA: Insufficient documentation

## 2015-01-17 DIAGNOSIS — Z8709 Personal history of other diseases of the respiratory system: Secondary | ICD-10-CM | POA: Insufficient documentation

## 2015-01-17 DIAGNOSIS — R51 Headache: Secondary | ICD-10-CM | POA: Insufficient documentation

## 2015-01-17 DIAGNOSIS — Z79899 Other long term (current) drug therapy: Secondary | ICD-10-CM | POA: Insufficient documentation

## 2015-01-17 DIAGNOSIS — Z7982 Long term (current) use of aspirin: Secondary | ICD-10-CM | POA: Insufficient documentation

## 2015-01-17 DIAGNOSIS — Z8659 Personal history of other mental and behavioral disorders: Secondary | ICD-10-CM | POA: Insufficient documentation

## 2015-01-17 DIAGNOSIS — Z7901 Long term (current) use of anticoagulants: Secondary | ICD-10-CM | POA: Insufficient documentation

## 2015-01-17 DIAGNOSIS — D649 Anemia, unspecified: Secondary | ICD-10-CM | POA: Insufficient documentation

## 2015-01-17 DIAGNOSIS — I671 Cerebral aneurysm, nonruptured: Secondary | ICD-10-CM | POA: Insufficient documentation

## 2015-01-17 DIAGNOSIS — R519 Headache, unspecified: Secondary | ICD-10-CM | POA: Insufficient documentation

## 2015-01-17 DIAGNOSIS — R040 Epistaxis: Secondary | ICD-10-CM | POA: Insufficient documentation

## 2015-01-17 HISTORY — DX: Cerebral aneurysm, nonruptured: I67.1

## 2015-01-17 LAB — CBC WITH DIFFERENTIAL/PLATELET
BASOS ABS: 0 10*3/uL (ref 0.0–0.1)
Basophils Relative: 0 %
EOS PCT: 2 %
Eosinophils Absolute: 0.1 10*3/uL (ref 0.0–0.7)
HCT: 30.7 % — ABNORMAL LOW (ref 36.0–46.0)
HEMOGLOBIN: 9.9 g/dL — AB (ref 12.0–15.0)
LYMPHS ABS: 0.9 10*3/uL (ref 0.7–4.0)
LYMPHS PCT: 17 %
MCH: 26.6 pg (ref 26.0–34.0)
MCHC: 32.2 g/dL (ref 30.0–36.0)
MCV: 82.5 fL (ref 78.0–100.0)
Monocytes Absolute: 0.4 10*3/uL (ref 0.1–1.0)
Monocytes Relative: 8 %
NEUTROS PCT: 74 %
Neutro Abs: 4 10*3/uL (ref 1.7–7.7)
PLATELETS: 207 10*3/uL (ref 150–400)
RBC: 3.72 MIL/uL — AB (ref 3.87–5.11)
RDW: 16.8 % — ABNORMAL HIGH (ref 11.5–15.5)
WBC: 5.4 10*3/uL (ref 4.0–10.5)

## 2015-01-17 LAB — PROTIME-INR
INR: 1.08 (ref 0.00–1.49)
PROTHROMBIN TIME: 14.2 s (ref 11.6–15.2)

## 2015-01-17 LAB — PLATELET INHIBITION P2Y12: PLATELET FUNCTION P2Y12: 8 [PRU] — AB (ref 194–418)

## 2015-01-17 LAB — APTT: aPTT: 29 seconds (ref 24–37)

## 2015-01-17 NOTE — Progress Notes (Signed)
Patient ID: Holly Gray, female   DOB: 03/08/1976, 39 y.o.   MRN: [id number] Patient's CT head was negative for acute abnormalities. Patient was seen and examined by Dr.Deveshwar. At this time recommend reduction of Plavix dose to 37.5 mg daily, continuation of aspirin 325 mg by mouth daily, resume her home medications including iron sulfate 325 mg by mouth daily, avoid strenuous activity and stay well hydrated, preferably with water. Patient has follow-up appointment with Dr. Dr. Estanislado Pandy in 2 weeks. Advise only Tylenol for headache at this time. Above plans discussed with patient and husband. Patient stable for discharge from IR standpoint.

## 2015-01-17 NOTE — Progress Notes (Signed)
Patient ID: Holly Gray, female   DOB: 1975-08-24, 40 y.o.   MRN: [id number]  Pt known to Neuro Interventional Radiology  Discharged just yesterday after R periophthalmic artery aneurysm pipeline stent placement 01/15/2015 Presenting now to ED with headache and nose bleed We are aware of ED visit and will see pt asap.  In meantime, I have ordered coags and P2y12 stat

## 2015-01-17 NOTE — ED Provider Notes (Signed)
CSN: [id number]     Arrival date & time 01/17/15  3382 History   First MD Initiated Contact with Patient 01/17/15 [phone]     Chief Complaint  Patient presents with  . Epistaxis  . Post-op Problem     (Consider location/radiation/quality/duration/timing/severity/associated sxs/prior Treatment) Patient is a 39 y.o. female presenting with nosebleeds. The history is provided by the patient.  Epistaxis Location:  L nare Associated symptoms: headaches    patient presents with epistaxis from her left nostril. Has been going today. Recent intracranial stenting for cerebral aneurysm. Also some dull headache. Is on aspirin and full dose Plavix now. May have felt a little lightheaded. No trauma.  Past Medical History  Diagnosis Date  . Bronchitis     hx of  . Anxiety   . Varicose veins   . Brain aneurysm   . Stroke [facility])     ?   Marland Kitchen Anemia   . Cerebral aneurysm, nonruptured     PT D/C on 01-16-15   Past Surgical History  Procedure Laterality Date  . Cyst excision      groin left  . Cesarean section    . Radiology with anesthesia N/A 10/30/2014    Procedure: RADIOLOGY WITH ANESTHESIA;  Surgeon: Luanne Bras, MD;  Location: [place];  Service: Radiology;  Laterality: N/A;  . Radiology with anesthesia N/A 01/15/2015    Procedure: ANEURYSM EMBOLIZATION;  Surgeon: Luanne Bras, MD;  Location: [place];  Service: Radiology;  Laterality: N/A;  . Cerebral aneurysm repair Left     01-16-15   Family History  Problem Relation Age of Onset  . Hypertension Mother    Social History  Substance Use Topics  . Smoking status: Never Smoker   . Smokeless tobacco: Not on file  . Alcohol Use: No   OB History    Gravida Para Term Preterm AB TAB SAB Ectopic Multiple Living   3 3 3       3      Review of Systems  Constitutional: Negative for appetite change and fatigue.  HENT: Positive for nosebleeds.   Respiratory: Negative for shortness of breath.   Musculoskeletal: Negative for back pain.   Neurological: Positive for headaches.  Hematological: Bruises/bleeds easily.      Allergies  Mango flavor  Home Medications   Prior to Admission medications   Medication Sig Start Date End Date Taking? Authorizing Provider  acetaminophen (TYLENOL) 325 MG tablet Take 650 mg by mouth 2 (two) times daily as needed (pain).   Yes Historical Provider, MD  aspirin 325 MG tablet Take 325 mg by mouth daily.   Yes Historical Provider, MD  clopidogrel (PLAVIX) 75 MG tablet Take 1 tablet (75 mg total) by mouth daily. 01/16/15  Yes Hedy Jacob, PA-C  ferrous sulfate 325 (65 FE) MG tablet Take 325 mg by mouth daily with breakfast.   Yes Historical Provider, MD  Polyethyl Glycol-Propyl Glycol (SYSTANE OP) Place 1 drop into both eyes 2 (two) times daily as needed (dry eyes).   Yes Historical Provider, MD   BP 124/78 mmHg  Pulse 84  Temp(Src) 99.1 F (37.3 C) (Oral)  Resp 18  Ht 5' (1.524 m)  Wt 179 lb (81.194 kg)  BMI 34.96 kg/m2  SpO2 99%  LMP 01/15/2015 Physical Exam  Constitutional: She appears well-developed.  HENT:  Area of active bleeding and left anterior nare. No blood in posterior pharynx. .  Cardiovascular: Normal rate.   Pulmonary/Chest: Effort normal.  Musculoskeletal: Normal range of motion.  Neurological: She is alert.    ED Course  .Epistaxis Management Date/Time: 01/17/2015 11:00 AM Performed by: Davonna Belling Authorized by: Davonna Belling Consent: Verbal consent obtained. Consent given by: patient Local anesthetic: 4% lido on cotton ball. Patient sedated: no Treatment site: left anterior Repair method: silver nitrate Post-procedure assessment: bleeding stopped Treatment complexity: simple Patient tolerance: Patient tolerated the procedure well with no immediate complications   (including critical care time) Labs Review Labs Reviewed  CBC WITH DIFFERENTIAL/PLATELET - Abnormal; Notable for the following:    RBC 3.72 (*)    Hemoglobin 9.9 (*)     HCT 30.7 (*)    RDW 16.8 (*)    All other components within normal limits  PLATELET INHIBITION P2Y12 - Abnormal; Notable for the following:    Platelet Function  P2Y12 8 (*)    All other components within normal limits  APTT  PROTIME-INR    Imaging Review Ct Head Wo Contrast  01/17/2015  CLINICAL DATA:  Headache and epistaxis; recent intracranial aneurysm repair EXAM: CT HEAD WITHOUT CONTRAST TECHNIQUE: Contiguous axial images were obtained from the base of the skull through the vertex without intravenous contrast. COMPARISON:  Brain MRI September 25, 2014 FINDINGS: The ventricles are normal in size and configuration. There is no intracranial mass, hemorrhage, extra-axial fluid collection, or midline shift. Gray-white compartments appear normal. No acute infarct evident. No intracranial edema appreciable. 12 the bony calvarium appears intact. The mastoid air cells are clear. Stents are noted in each cavernous carotid region without surrounding edema. IMPRESSION: Stents are noted in each cavernous carotid region without surrounding edema. There is no intracranial mass, hemorrhage, or focal gray - white compartment lesions/acute appearing infarct. Electronically Signed   By: Lowella Grip III M.D.   On: 01/17/2015 12:04   I have personally reviewed and evaluated these images and lab results as part of my medical decision-making.   EKG Interpretation None      MDM   Final diagnoses:  Headache  Anterior epistaxis    Patient with anterior epistaxis on left side. Bleeding controlled after topical lidocaine and silver nitrate application. Seen by neuro interventionalist discharge home.Davonna Belling, MD 01/17/15 1245

## 2015-01-17 NOTE — ED Notes (Signed)
Nose not actively bleeding at present-- nosebleed cart at bedside.

## 2015-01-17 NOTE — ED Notes (Signed)
PT woke up this AM with a nose bleed. Pt also reports she was D/C from [facility] neuro ICU following surgery.

## 2015-01-17 NOTE — ED Notes (Signed)
Pt has had 2 aneurysm repairs- last one was 10/31-- has had a nosebleed that started this am.

## 2015-01-17 NOTE — Discharge Instructions (Signed)

## 2015-01-17 NOTE — Progress Notes (Signed)
Patient ID: Holly Gray, female   DOB: 1975/11/19, 39 y.o.   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 Patient was initially seen in the [facility] ED in July 2016 with symptoms of headache and right arm weakness. 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 She was referred to Dr. Dr. Estanislado Pandy by Dr. Doy Mince for treatment options. She underwent a pipeline stent placement into left ICA periophthalmic artery aneurysm on 10/30/14. She was recently discharged from hospital on 11/1 following pipeline stenting of a right ICA periophthalmic artery aneurysm. She presents again today to the ED with complaints of headache and nosebleed. Patient states that she has had  right frontal headache prior to initial diagnosis of aneurysms. She states she currently rates her headache as a 7 out of 10 and is right frontal in location with radiation to right maxillary/nasal labial fold region. She takes Tylenol only with minimal relief. She states she woke up this morning with a bleed from her left nostril. She denies prior nosebleeds. She claims that prior to discharge yesterday her nose felt dry secondary to use of nasal cannula for oxygen delivery. She also has history of seasonal allergies. Other complaints at this time  include occasional "shooting stars" in visual fields as well as some shadowing of vision in right upper visual field. She currently denies fevers, chills, chest pain, dyspnea, cough, abdominal/ back pain, vomiting, weakness, numbness, blurred /double vision, speech difficulties, or balance difficulties. She does have some occasional nausea and fatigue. She is currently on her menstrual period. She is on Plavix 75 mg by mouth daily and aspirin 325 mg by mouth daily.   Allergies: Mango flavor  Medications: Prior to  Admission medications   Medication Sig Start Date End Date Taking? Authorizing Provider  acetaminophen (TYLENOL) 325 MG tablet Take 650 mg by mouth 2 (two) times daily as needed (pain).   Yes Historical Provider, MD  aspirin 325 MG tablet Take 325 mg by mouth daily.   Yes Historical Provider, MD  clopidogrel (PLAVIX) 75 MG tablet Take 1 tablet (75 mg total) by mouth daily. 01/16/15  Yes Hedy Jacob, PA-C  ferrous sulfate 325 (65 FE) MG tablet Take 325 mg by mouth daily with breakfast.   Yes Historical Provider, MD  Polyethyl Glycol-Propyl Glycol (SYSTANE OP) Place 1 drop into both eyes 2 (two) times daily as needed (dry eyes).   Yes Historical Provider, MD     Vital Signs: BP 124/78 mmHg  Pulse 84  Temp(Src) 99.1 F (37.3 C) (Oral)  Resp 18  Ht 5' (1.524 m)  Wt 179 lb (81.194 kg)  BMI 34.96 kg/m2  SpO2 99%  LMP 01/15/2015  Physical Exam patient is awake, alert. Chest is clear to auscultation bilaterally. Heart with regular rate and rhythm. Abdomen soft, positive bowel sounds, nontender. Puncture site right common femoral artery soft, clean, nontender, no hematoma. Extremities- full range of motion, no edema, intact distal pulses. Speech normal, extraocular movements intact, no drift, face symmetrical, tongue midline, fine motor movements normal; strength 5 over 5 in all 4 extremities, no sensory deficits noted; no further bleeding from left nostril appreciated.  Imaging: No results found.  Labs:  CBC:  Recent Labs  12/07/14 1558 01/08/15 1030 01/16/15 0235 01/17/15 0927  WBC 4.8 3.5* 3.6* 5.4  HGB 9.4* 9.3* 7.8* 9.9*  HCT 29.6* 29.5* 25.2* 30.7*  PLT 292 251 174 207    COAGS:  Recent Labs  09/25/14 1127 10/17/14 1040 10/30/14 2300 01/08/15 1030 01/17/15 0950  INR 1.15 1.11  --  1.08 1.08  APTT 27 28 48* 28 29    BMP:  Recent Labs  10/17/14 1040 10/31/14 0545 01/08/15 1030 01/16/15 0940  NA 137 137 139 135  K 4.0 3.6 3.7 3.6  CL 108 108 109 106    CO2 24 24 22 24   GLUCOSE 98 88 86 96  BUN 15 <5* 9 <5*  CALCIUM 9.0 8.0* 9.1 8.1*  CREATININE 0.60 0.51 0.56 0.53  GFRNONAA >60 >60 >60 >60  GFRAA >60 >60 >60 >60    LIVER FUNCTION TESTS:  Recent Labs  09/25/14 1127 01/08/15 1030  BILITOT 0.3 0.4  AST 27 18  ALT 26 17  ALKPHOS 51 52  PROT 6.8 7.3  ALBUMIN 3.5 3.8    Assessment and Plan: Patient with history of chronic headaches, and incidental finding of bilateral ICA periophthalmic artery aneurysms on subsequent imaging. Status post pipeline stenting of left ICA periophthalmic aneurysm in August 2016 followed by pipeline stenting of right ICA periophthalmic aneurysm on 01/15/15. Recently discharged from hospital on 01/16/15; presenting now with headache and bleeding from left nostril. CBC obtained today reveals WBC 5.4, hemoglobin 9.9, platelets 207K. PT 14.2, INR 1.08, PTT 29, P2 Y12-8. Bleeding from left nostril has resolved. Case discussed with Dr. Dr. Estanislado Pandy. Will plan CT head without contrast, reduction of Plavix dose to 37.5 mg po daily, continue aspirin 325 mg po daily. Dr. Estanislado Pandy will evaluate patient once CT head results are obtained.   Signed: D. Rowe Robert 01/17/2015, 11:02 AM   I spent a total of 15 minutes at the the patient's bedside AND on the patient's hospital floor or unit, greater than 50% of which was counseling/coordinating care for right periophthalmic artery aneurysm stenting

## 2015-01-17 NOTE — ED Notes (Signed)
Interventional radiology PA in to examine pt.

## 2015-01-18 ENCOUNTER — Telehealth (HOSPITAL_COMMUNITY): Payer: Self-pay

## 2015-01-18 NOTE — Telephone Encounter (Signed)
Called pt to schedule f/u with Dr. Estanislado Pandy, left message for pt to call back. AW

## 2015-01-31 ENCOUNTER — Encounter (HOSPITAL_COMMUNITY): Payer: Self-pay | Admitting: *Deleted

## 2015-01-31 ENCOUNTER — Emergency Department (HOSPITAL_COMMUNITY)
Admission: EM | Admit: 2015-01-31 | Discharge: 2015-01-31 | Disposition: A | Discharge status: Home / Self Care | Payer: Self-pay | Attending: Emergency Medicine | Admitting: Emergency Medicine

## 2015-01-31 ENCOUNTER — Emergency Department (HOSPITAL_COMMUNITY): Payer: Self-pay

## 2015-01-31 DIAGNOSIS — G4489 Other headache syndrome: Secondary | ICD-10-CM | POA: Insufficient documentation

## 2015-01-31 DIAGNOSIS — Z7982 Long term (current) use of aspirin: Secondary | ICD-10-CM | POA: Insufficient documentation

## 2015-01-31 DIAGNOSIS — Z8673 Personal history of transient ischemic attack (TIA), and cerebral infarction without residual deficits: Secondary | ICD-10-CM | POA: Insufficient documentation

## 2015-01-31 DIAGNOSIS — Z7902 Long term (current) use of antithrombotics/antiplatelets: Secondary | ICD-10-CM | POA: Insufficient documentation

## 2015-01-31 DIAGNOSIS — D508 Other iron deficiency anemias: Secondary | ICD-10-CM | POA: Insufficient documentation

## 2015-01-31 DIAGNOSIS — F419 Anxiety disorder, unspecified: Secondary | ICD-10-CM | POA: Insufficient documentation

## 2015-01-31 LAB — CBC WITH DIFFERENTIAL/PLATELET
Basophils Absolute: 0 10*3/uL (ref 0.0–0.1)
Basophils Relative: 0 %
EOS ABS: 0.1 10*3/uL (ref 0.0–0.7)
EOS PCT: 1 %
HCT: 25.5 % — ABNORMAL LOW (ref 36.0–46.0)
Hemoglobin: 7.9 g/dL — ABNORMAL LOW (ref 12.0–15.0)
LYMPHS ABS: 1.8 10*3/uL (ref 0.7–4.0)
Lymphocytes Relative: 25 %
MCH: 25.3 pg — AB (ref 26.0–34.0)
MCHC: 31 g/dL (ref 30.0–36.0)
MCV: 81.7 fL (ref 78.0–100.0)
MONOS PCT: 9 %
Monocytes Absolute: 0.7 10*3/uL (ref 0.1–1.0)
Neutro Abs: 4.6 10*3/uL (ref 1.7–7.7)
Neutrophils Relative %: 65 %
PLATELETS: 298 10*3/uL (ref 150–400)
RBC: 3.12 MIL/uL — ABNORMAL LOW (ref 3.87–5.11)
RDW: 15.6 % — AB (ref 11.5–15.5)
WBC: 7.2 10*3/uL (ref 4.0–10.5)

## 2015-01-31 MED ORDER — BUTALBITAL-APAP-CAFFEINE 50-325-40 MG PO TABS: 1.0000 | ORAL_TABLET | Freq: Four times a day (QID) | ORAL | Status: AC | PRN

## 2015-01-31 MED ORDER — METOCLOPRAMIDE HCL 5 MG/ML IJ SOLN
10.0000 mg | Freq: Once | INTRAMUSCULAR | Status: DC
  Filled 2015-01-31: qty 2

## 2015-01-31 MED ORDER — LORAZEPAM 2 MG/ML IJ SOLN
1.0000 mg | Freq: Once | INTRAMUSCULAR | Status: DC
  Filled 2015-01-31: qty 1

## 2015-01-31 MED ORDER — DIPHENHYDRAMINE HCL 50 MG/ML IJ SOLN
25.0000 mg | Freq: Once | INTRAMUSCULAR | Status: AC
  Administered 2015-01-31: 25 mg via INTRAVENOUS

## 2015-01-31 MED ORDER — LORAZEPAM 2 MG/ML IJ SOLN
1.0000 mg | Freq: Once | INTRAMUSCULAR | Status: AC
  Administered 2015-01-31: 1 mg via INTRAVENOUS

## 2015-01-31 MED ORDER — METOCLOPRAMIDE HCL 5 MG/ML IJ SOLN
10.0000 mg | Freq: Once | INTRAMUSCULAR | Status: AC
  Administered 2015-01-31: 10 mg via INTRAVENOUS

## 2015-01-31 MED ORDER — DIPHENHYDRAMINE HCL 50 MG/ML IJ SOLN
25.0000 mg | Freq: Once | INTRAMUSCULAR | Status: DC
  Filled 2015-01-31: qty 1

## 2015-01-31 MED ORDER — OXYCODONE-ACETAMINOPHEN 5-325 MG PO TABS
1.0000 | ORAL_TABLET | Freq: Once | ORAL | Status: AC
  Administered 2015-01-31: 1 via ORAL

## 2015-01-31 MED ORDER — OXYCODONE-ACETAMINOPHEN 5-325 MG PO TABS
ORAL_TABLET | ORAL | Status: AC
  Filled 2015-01-31: qty 1

## 2015-01-31 NOTE — Discharge Instructions (Signed)
Anemia, Nonspecific Anemia is a condition in which the concentration of red blood cells or hemoglobin in the blood is below normal. Hemoglobin is a substance in red blood cells that carries oxygen to the tissues of the body. Anemia results in not enough oxygen reaching these tissues.  CAUSES  Common causes of anemia include:   Excessive bleeding. Bleeding may be internal or external. This includes excessive bleeding from periods (in women) or from the intestine.   Poor nutrition.   Chronic kidney, thyroid, and liver disease.  Bone marrow disorders that decrease red blood cell production.  Cancer and treatments for cancer.  HIV, AIDS, and their treatments.  Spleen problems that increase red blood cell destruction.  Blood disorders.  Excess destruction of red blood cells due to infection, medicines, and autoimmune disorders. SIGNS AND SYMPTOMS   Minor weakness.   Dizziness.   Headache.  Palpitations.   Shortness of breath, especially with exercise.   Paleness.  Cold sensitivity.  Indigestion.  Nausea.  Difficulty sleeping.  Difficulty concentrating. Symptoms may occur suddenly or they may develop slowly.  DIAGNOSIS  Additional blood tests are often needed. These help your health care provider determine the best treatment. Your health care provider will check your stool for blood and look for other causes of blood loss.  TREATMENT  Treatment varies depending on the cause of the anemia. Treatment can include:   Supplements of iron, vitamin 123456, or folic acid.   Hormone medicines.   A blood transfusion. This may be needed if blood loss is severe.   Hospitalization. This may be needed if there is significant continual blood loss.   Dietary changes.  Spleen removal. HOME CARE INSTRUCTIONS Keep all follow-up appointments. It often takes many weeks to correct anemia, and having your health care provider check on your condition and your response to  treatment is very important. SEEK IMMEDIATE MEDICAL CARE IF:   You develop extreme weakness, shortness of breath, or chest pain.   You become dizzy or have trouble concentrating.  You develop heavy vaginal bleeding.   You develop a rash.   You have bloody or black, tarry stools.   You faint.   You vomit up blood.   You vomit repeatedly.   You have abdominal pain.  You have a fever or persistent symptoms for more than 2-3 days.   You have a fever and your symptoms suddenly get worse.   You are dehydrated.  MAKE SURE YOU:  Understand these instructions.  Will watch your condition.  Will get help right away if you are not doing well or get worse.   This information is not intended to replace advice given to you by your health care provider. Make sure you discuss any questions you have with your health care provider.   Document Released: 04/10/2004 Document Revised: 11/03/2012 Document Reviewed: 08/27/2012 Elsevier Interactive Patient Education 2016 [place] Headache Without Cause A headache is pain or discomfort felt around the head or neck area. The specific cause of a headache may not be found. There are many causes and types of headaches. A few common ones are:  Tension headaches.  Migraine headaches.  Cluster headaches.  Chronic daily headaches. HOME CARE INSTRUCTIONS  Watch your condition for any changes. Take these steps to help with your condition: Managing Pain  Take over-the-counter and prescription medicines only as told by your health care provider.  Lie down in a dark, quiet room when you have a headache.  If directed, apply  ice to the head and neck area:  Put ice in a plastic bag.  Place a towel between your skin and the bag.  Leave the ice on for 20 minutes, 2-3 times per day.  Use a heating pad or hot shower to apply heat to the head and neck area as told by your health care provider.  Keep lights dim if bright  lights bother you or make your headaches worse. Eating and Drinking  Eat meals on a regular schedule.  Limit alcohol use.  Decrease the amount of caffeine you drink, or stop drinking caffeine. General Instructions  Keep all follow-up visits as told by your health care provider. This is important.  Keep a headache journal to help find out what may trigger your headaches. For example, write down:  What you eat and drink.  How much sleep you get.  Any change to your diet or medicines.  Try massage or other relaxation techniques.  Limit stress.  Sit up straight, and do not tense your muscles.  Do not use tobacco products, including cigarettes, chewing tobacco, or e-cigarettes. If you need help quitting, ask your health care provider.  Exercise regularly as told by your health care provider.  Sleep on a regular schedule. Get 7-9 hours of sleep, or the amount recommended by your health care provider. SEEK MEDICAL CARE IF:   Your symptoms are not helped by medicine.  You have a headache that is different from the usual headache.  You have nausea or you vomit.  You have a fever. SEEK IMMEDIATE MEDICAL CARE IF:   Your headache becomes severe.  You have repeated vomiting.  You have a stiff neck.  You have a loss of vision.  You have problems with speech.  You have pain in the eye or ear.  You have muscular weakness or loss of muscle control.  You lose your balance or have trouble walking.  You feel faint or pass out.  You have confusion.   This information is not intended to replace advice given to you by your health care provider. Make sure you discuss any questions you have with your health care provider.   Document Released: 03/03/2005 Document Revised: 11/22/2014 Document Reviewed: 06/26/2014 Elsevier Interactive Patient Education [employer].

## 2015-01-31 NOTE — ED Notes (Signed)
Pt reports hx aneurysm repair on 10/31, having headaches since surgery but todays pain is different and more severe. Reports no relief with tylenol. Denies n/v but is having dizziness.

## 2015-01-31 NOTE — ED Provider Notes (Signed)
CSN: [id number]     Arrival date & time 01/31/15  1618 History   First MD Initiated Contact with Patient 01/31/15 1823     Chief Complaint  Patient presents with  . Headache     (Consider location/radiation/quality/duration/timing/severity/associated sxs/prior Treatment) HPI Comments: Patient here complaining of persistent right-sided headaches since she had surgery for aneurysm. Now notes some soreness to her right paracervical region that radiates down to her arm. Denies any photophobia. No vomiting noted. No fever appreciated. Has used Percocet with some relief. Denied any associated upper or lower extreme weakness. No bowel or bladder dysfunction. Symptoms have been progressively worse over the past 24 hours. She is concerned that she may have another bleeding aneurysm  Patient is a 39 y.o. female presenting with headaches. The history is provided by the patient.  Headache   Past Medical History  Diagnosis Date  . Bronchitis     hx of  . Anxiety   . Varicose veins   . Brain aneurysm   . Stroke [facility])     ?   Marland Kitchen Anemia   . Cerebral aneurysm, nonruptured     PT D/C on 01-16-15   Past Surgical History  Procedure Laterality Date  . Cyst excision      groin left  . Cesarean section    . Radiology with anesthesia N/A 10/30/2014    Procedure: RADIOLOGY WITH ANESTHESIA;  Surgeon: Luanne Bras, MD;  Location: [place];  Service: Radiology;  Laterality: N/A;  . Radiology with anesthesia N/A 01/15/2015    Procedure: ANEURYSM EMBOLIZATION;  Surgeon: Luanne Bras, MD;  Location: [place];  Service: Radiology;  Laterality: N/A;  . Cerebral aneurysm repair Left     01-16-15   Family History  Problem Relation Age of Onset  . Hypertension Mother    Social History  Substance Use Topics  . Smoking status: Never Smoker   . Smokeless tobacco: None  . Alcohol Use: No   OB History    Gravida Para Term Preterm AB TAB SAB Ectopic Multiple Living   3 3 3       3      Review of  Systems  Neurological: Positive for headaches.  All other systems reviewed and are negative.     Allergies  Mango flavor  Home Medications   Prior to Admission medications   Medication Sig Start Date End Date Taking? Authorizing Provider  acetaminophen (TYLENOL) 325 MG tablet Take 650 mg by mouth 2 (two) times daily as needed (pain).    Historical Provider, MD  aspirin 325 MG tablet Take 325 mg by mouth daily.    Historical Provider, MD  clopidogrel (PLAVIX) 75 MG tablet Take 1 tablet (75 mg total) by mouth daily. 01/16/15   Hedy Jacob, PA-C  ferrous sulfate 325 (65 FE) MG tablet Take 325 mg by mouth daily with breakfast.    Historical Provider, MD  Polyethyl Glycol-Propyl Glycol (SYSTANE OP) Place 1 drop into both eyes 2 (two) times daily as needed (dry eyes).    Historical Provider, MD   BP 137/82 mmHg  Pulse 85  Temp(Src) 98.6 F (37 C) (Oral)  Resp 18  SpO2 100%  LMP 01/15/2015 Physical Exam  Constitutional: She is oriented to person, place, and time. She appears well-developed and well-nourished.  Non-toxic appearance. No distress.  HENT:  Head: Normocephalic and atraumatic.  Eyes: Conjunctivae, EOM and lids are normal. Pupils are equal, round, and reactive to light.  Neck: Normal range of motion. Neck supple.  No tracheal deviation present. No thyroid mass present.  Cardiovascular: Normal rate, regular rhythm and normal heart sounds.  Exam reveals no gallop.   No murmur heard. Pulmonary/Chest: Effort normal and breath sounds normal. No stridor. No respiratory distress. She has no decreased breath sounds. She has no wheezes. She has no rhonchi. She has no rales.  Abdominal: Soft. Normal appearance and bowel sounds are normal. She exhibits no distension. There is no tenderness. There is no rebound and no CVA tenderness.  Musculoskeletal: Normal range of motion. She exhibits no edema or tenderness.  Neurological: She is alert and oriented to person, place, and time. She  has normal strength. No cranial nerve deficit or sensory deficit. Coordination and gait normal. GCS eye subscore is 4. GCS verbal subscore is 5. GCS motor subscore is 6.  Skin: Skin is warm and dry. No abrasion and no rash noted.  Psychiatric: Her speech is normal and behavior is normal. Her mood appears anxious.  Nursing note and vitals reviewed.   ED Course  Procedures (including critical care time) Labs Review Labs Reviewed  CBC WITH DIFFERENTIAL/PLATELET    Imaging Review No results found. I have personally reviewed and evaluated these images and lab results as part of my medical decision-making.   EKG Interpretation None      MDM   Final diagnoses:  None    Patient's hemoglobin noted and she states that she has chronic anemia and is currently taking iron tablets. Given medications for headache and feels better. Head CT without evidence of acute bleed. Repeat neurological exam at time of discharge remained stable    Lacretia Leigh, MD 01/31/15 2109

## 2015-02-02 ENCOUNTER — Other Ambulatory Visit: Payer: Self-pay | Admitting: Radiology

## 2015-02-02 ENCOUNTER — Ambulatory Visit (HOSPITAL_COMMUNITY)
Admission: RE | Admit: 2015-02-02 | Discharge: 2015-02-02 | Disposition: A | Discharge status: Home / Self Care | Payer: Self-pay | Source: Ambulatory Visit | Attending: Radiology | Admitting: Radiology

## 2015-02-02 DIAGNOSIS — I729 Aneurysm of unspecified site: Secondary | ICD-10-CM

## 2015-02-19 ENCOUNTER — Telehealth (HOSPITAL_COMMUNITY): Payer: Self-pay | Admitting: *Deleted

## 2015-02-19 NOTE — Telephone Encounter (Signed)
Spoke with Dr. Estanislado Pandy he order a refill of Plavix for 2 months.  Called [facility] and requested refill.

## 2015-04-09 ENCOUNTER — Emergency Department (INDEPENDENT_AMBULATORY_CARE_PROVIDER_SITE_OTHER)
Admission: EM | Admit: 2015-04-09 | Discharge: 2015-04-09 | Disposition: A | Discharge status: Home / Self Care | Payer: Self-pay | Source: Home / Self Care | Attending: Family Medicine | Admitting: Family Medicine

## 2015-04-09 ENCOUNTER — Encounter (HOSPITAL_COMMUNITY): Payer: Self-pay | Admitting: Emergency Medicine

## 2015-04-09 DIAGNOSIS — R5383 Other fatigue: Secondary | ICD-10-CM

## 2015-04-09 DIAGNOSIS — R202 Paresthesia of skin: Secondary | ICD-10-CM

## 2015-04-09 DIAGNOSIS — D649 Anemia, unspecified: Secondary | ICD-10-CM

## 2015-04-09 DIAGNOSIS — R200 Anesthesia of skin: Secondary | ICD-10-CM

## 2015-04-09 DIAGNOSIS — Z8679 Personal history of other diseases of the circulatory system: Secondary | ICD-10-CM

## 2015-04-09 DIAGNOSIS — Z9889 Other specified postprocedural states: Secondary | ICD-10-CM

## 2015-04-09 LAB — POCT I-STAT, CHEM 8
BUN: 13 mg/dL (ref 6–20)
CHLORIDE: 107 mmol/L (ref 101–111)
Calcium, Ion: 1.24 mmol/L — ABNORMAL HIGH (ref 1.12–1.23)
Creatinine, Ser: 0.5 mg/dL (ref 0.44–1.00)
Glucose, Bld: 120 mg/dL — ABNORMAL HIGH (ref 65–99)
HEMATOCRIT: 31 % — AB (ref 36.0–46.0)
HEMOGLOBIN: 10.5 g/dL — AB (ref 12.0–15.0)
POTASSIUM: 4.2 mmol/L (ref 3.5–5.1)
Sodium: 142 mmol/L (ref 135–145)
TCO2: 25 mmol/L (ref 0–100)

## 2015-04-09 LAB — POCT URINALYSIS DIP (DEVICE)
Bilirubin Urine: NEGATIVE
Glucose, UA: NEGATIVE mg/dL
HGB URINE DIPSTICK: NEGATIVE
KETONES UR: NEGATIVE mg/dL
Leukocytes, UA: NEGATIVE
Nitrite: NEGATIVE
PH: 6.5 (ref 5.0–8.0)
PROTEIN: NEGATIVE mg/dL
Specific Gravity, Urine: 1.025 (ref 1.005–1.030)
Urobilinogen, UA: 0.2 mg/dL (ref 0.0–1.0)

## 2015-04-09 NOTE — ED Provider Notes (Signed)
CSN: [id number]     Arrival date & time 04/09/15  1321 History   First MD Initiated Contact with Patient 04/09/15 1503     Chief Complaint  Patient presents with  . Fatigue  . Tingling   (Consider location/radiation/quality/duration/timing/severity/associated sxs/prior Treatment) HPI Comments: 40 year old [place] female who speaks English fluently has complaints of tingling in the left hand as well as both feet for 4 days. The tingling that occurs in the feet almost exclusively occurs after sitting for prolonged period of time. After standing tingling goes away. Occasionally she will have tingling in the left hand and digits. She is unaware of anything that elicits the tingling or makes it worse. It spontaneously resolves. He comes and goes without any particular recognized pattern. She denies a decrease or loss in sensation or motor activity. Her second concern is that of feeling tired and fatigued for over one month. She has a history of anemia and request to have an H&H performed. She states that she does not know the source of her low hemoglobin and was initially prescribed iron supplements. She also states that she saw some discharge coming either from the vagina or urethra and requested a urinalysis. Denies dysuria or frequency. An additional concern was that on rare occasion when leaning forward she has had sensation of palpitations. The symptoms resolved spontaneously and lasting for very short time. Leaning forward tends to elicit this sensation. He denies associated chest pain, heaviness, tightness, pressure or fullness. She has a history of a brain aneurysm and questionable CVA with no residual effect. She is currently taking Plavix and aspirin. She states in her history that she went to the emergency department for evaluation of heaviness in the right arm and a brain angiogram was performed. It was at that time that the aneurysm was discovered. She has had 2 procedures for the aneurysm: The  first on August 16 the second on October 31. She denies having residual symptoms from this incidental finding. She was told that she may have frequent headaches associated with this procedure and she does confirm this. She has had no worsening with headaches. She denies problems with vision, speech, hearing, swallowing, focal weakness, motor weakness.   Past Medical History  Diagnosis Date  . Bronchitis     hx of  . Anxiety   . Varicose veins   . Brain aneurysm   . Stroke [facility])     ?   Marland Kitchen Anemia   . Cerebral aneurysm, nonruptured     PT D/C on 01-16-15   Past Surgical History  Procedure Laterality Date  . Cyst excision      groin left  . Cesarean section    . Radiology with anesthesia N/A 10/30/2014    Procedure: RADIOLOGY WITH ANESTHESIA;  Surgeon: Luanne Bras, MD;  Location: [place];  Service: Radiology;  Laterality: N/A;  . Radiology with anesthesia N/A 01/15/2015    Procedure: ANEURYSM EMBOLIZATION;  Surgeon: Luanne Bras, MD;  Location: [place];  Service: Radiology;  Laterality: N/A;  . Cerebral aneurysm repair Left     01-16-15   Family History  Problem Relation Age of Onset  . Hypertension Mother    Social History  Substance Use Topics  . Smoking status: Never Smoker   . Smokeless tobacco: None  . Alcohol Use: No   OB History    Gravida Para Term Preterm AB TAB SAB Ectopic Multiple Living   3 3 3        3  Review of Systems  Constitutional: Positive for activity change and fatigue. Negative for fever.  HENT: Negative for ear pain, postnasal drip, sinus pressure, tinnitus and trouble swallowing.   Eyes: Negative.  Negative for photophobia and visual disturbance.  Respiratory: Negative.   Cardiovascular: Positive for palpitations. Negative for chest pain and leg swelling.  Gastrointestinal: Negative.   Genitourinary: Negative.   Musculoskeletal: Negative for myalgias, back pain, arthralgias, gait problem, neck pain and neck stiffness.  Skin: Negative.    Neurological: Positive for numbness and headaches. Negative for dizziness, tremors, seizures, syncope, facial asymmetry, speech difficulty and weakness.  Psychiatric/Behavioral: Negative for behavioral problems, confusion and decreased concentration. The patient is not hyperactive.     Allergies  Mango flavor  Home Medications   Prior to Admission medications   Medication Sig Start Date End Date Taking? Authorizing Provider  acetaminophen (TYLENOL) 325 MG tablet Take 650 mg by mouth 2 (two) times daily as needed (pain).    Historical Provider, MD  aspirin 325 MG tablet Take 325 mg by mouth daily.    Historical Provider, MD  butalbital-acetaminophen-caffeine (FIORICET) 50-325-40 MG tablet Take 1-2 tablets by mouth every 6 (six) hours as needed for headache. 01/31/15 01/31/16  Lacretia Leigh, MD  clopidogrel (PLAVIX) 75 MG tablet Take 1 tablet (75 mg total) by mouth daily. 01/16/15   Hedy Jacob, PA-C  ferrous sulfate 325 (65 FE) MG tablet Take 325 mg by mouth daily with breakfast.    Historical Provider, MD  Polyethyl Glycol-Propyl Glycol (SYSTANE OP) Place 1 drop into both eyes 2 (two) times daily as needed (dry eyes).    Historical Provider, MD   Meds Ordered and Administered this Visit  Medications - No data to display  BP 134/79 mmHg  Pulse 79  Temp(Src) 98 F (36.7 C) (Oral)  Resp 12  SpO2 100%  LMP 03/18/2015 No data found.   Physical Exam  Constitutional: She is oriented to person, place, and time. She appears well-developed and well-nourished. No distress.  HENT:  Head: Normocephalic and atraumatic.  Mouth/Throat: Oropharynx is clear and moist. No oropharyngeal exudate.  Bilateral TMs are normal Oropharynx is clear. Soft palate rises symmetrically. Tongue and uvula midline.  Eyes: Conjunctivae and EOM are normal. Pupils are equal, round, and reactive to light.  Neck: Normal range of motion. Neck supple.  No bruits bilaterally.  Cardiovascular: Normal rate, normal  heart sounds and intact distal pulses.   No murmur heard. Pulmonary/Chest: Effort normal and breath sounds normal. No respiratory distress. She has no wheezes.  Abdominal: Soft. There is no tenderness.  Musculoskeletal: Normal range of motion.  Radial pulses are 2+. Pedal pulses 2+. Posterior tibialis pulses 2+.  Lymphadenopathy:    She has no cervical adenopathy.  Neurological: She is alert and oriented to person, place, and time. She has normal strength. She displays no tremor. No cranial nerve deficit or sensory deficit. She exhibits normal muscle tone. Coordination normal.  Hand coordination is intact. No dysdiadochokinesia. Sensation to bilateral hands and feet are intact. Strength and motor activity is intact.  Skin: Skin is warm and dry. No rash noted.  Psychiatric: She has a normal mood and affect. Her behavior is normal.  Nursing note and vitals reviewed.   ED Course  Procedures (including critical care time)  Labs Review Labs Reviewed  POCT I-STAT, CHEM 8 - Abnormal; Notable for the following:    Glucose, Bld 120 (*)    Calcium, Ion 1.24 (*)    Hemoglobin 10.5 (*)  HCT 31.0 (*)    All other components within normal limits  POCT URINALYSIS DIP (DEVICE)    Results for orders placed or performed during the hospital encounter of 04/09/15  POCT urinalysis dip (device)  Result Value Ref Range   Glucose, UA NEGATIVE NEGATIVE mg/dL   Bilirubin Urine NEGATIVE NEGATIVE   Ketones, ur NEGATIVE NEGATIVE mg/dL   Specific Gravity, Urine 1.025 1.005 - 1.030   Hgb urine dipstick NEGATIVE NEGATIVE   pH 6.5 5.0 - 8.0   Protein, ur NEGATIVE NEGATIVE mg/dL   Urobilinogen, UA 0.2 0.0 - 1.0 mg/dL   Nitrite NEGATIVE NEGATIVE   Leukocytes, UA NEGATIVE NEGATIVE  I-STAT, chem 8  Result Value Ref Range   Sodium 142 135 - 145 mmol/L   Potassium 4.2 3.5 - 5.1 mmol/L   Chloride 107 101 - 111 mmol/L   BUN 13 6 - 20 mg/dL   Creatinine, Ser 0.50 0.44 - 1.00 mg/dL   Glucose, Bld 120 (H) 65  - 99 mg/dL   Calcium, Ion 1.24 (H) 1.12 - 1.23 mmol/L   TCO2 25 0 - 100 mmol/L   Hemoglobin 10.5 (L) 12.0 - 15.0 g/dL   HCT 31.0 (L) 36.0 - 46.0 %     Imaging Review No results found. ED ECG REPORT   Date: 04/09/2015  EKG Time: 4:17 PM  Rate: 77  Rhythm: normal sinus rhythm,    Axis:normal   Intervals:none  ST&T Change: normal, no changes  Narrative Interpretation: normal EKG             Visual Acuity Review  Right Eye Distance:   Left Eye Distance:   Bilateral Distance:    Right Eye Near:   Left Eye Near:    Bilateral Near:         MDM   1. Numbness and tingling   2. Other fatigue   3. Anemia, unspecified anemia type   4. H/O cerebral aneurysm repair    Physical exam unremarkable. EKG is normal. Neuro exam unremarkable. Lab work as noted above. Her hemoglobin is 10.5 which is in the higher range for her normal. The numbness in the feet likely due to prolonged arterial compression after prolonged sitting. Limit prolonged sitting particularly on hard chairs or seat that puts pressure to the back of your legs. For worsening, new symptoms or problems, headache, nausea vomiting, fever, abdominal pain, numbness or weakness on one side of the body, changes in vision or other problems go to emergency department promptly. Otherwise, call your PCP today or tomorrow for a follow-up appointment this week.    Janne Napoleon, NP 04/09/15 1620

## 2015-04-09 NOTE — ED Notes (Addendum)
Patient reports feeling weak for a weak.  Patient describes it as "just not feeling herself" , more tired than usual.  Patient has concerns for anemia.  Patient also reports tingling in left hand and both feet.  Patient says this comes and goes.  Says it is not numbness, but a "funny feeling" in left hand and in both feet, left feels worse than right foot.  Patient reports a "light headache since surgery...doctor said that was normal .   .Holly Gray

## 2015-04-09 NOTE — Discharge Instructions (Signed)
Anemia, Nonspecific Anemia is a condition in which the concentration of red blood cells or hemoglobin in the blood is below normal. Hemoglobin is a substance in red blood cells that carries oxygen to the tissues of the body. Anemia results in not enough oxygen reaching these tissues.  CAUSES  Common causes of anemia include:   Excessive bleeding. Bleeding may be internal or external. This includes excessive bleeding from periods (in women) or from the intestine.   Poor nutrition.   Chronic kidney, thyroid, and liver disease.  Bone marrow disorders that decrease red blood cell production.  Cancer and treatments for cancer.  HIV, AIDS, and their treatments.  Spleen problems that increase red blood cell destruction.  Blood disorders.  Excess destruction of red blood cells due to infection, medicines, and autoimmune disorders. SIGNS AND SYMPTOMS   Minor weakness.   Dizziness.   Headache.  Palpitations.   Shortness of breath, especially with exercise.   Paleness.  Cold sensitivity.  Indigestion.  Nausea.  Difficulty sleeping.  Difficulty concentrating. Symptoms may occur suddenly or they may develop slowly.  DIAGNOSIS  Additional blood tests are often needed. These help your health care provider determine the best treatment. Your health care provider will check your stool for blood and look for other causes of blood loss.  TREATMENT  Treatment varies depending on the cause of the anemia. Treatment can include:   Supplements of iron, vitamin 123456, or folic acid.   Hormone medicines.   A blood transfusion. This may be needed if blood loss is severe.   Hospitalization. This may be needed if there is significant continual blood loss.   Dietary changes.  Spleen removal. HOME CARE INSTRUCTIONS Keep all follow-up appointments. It often takes many weeks to correct anemia, and having your health care provider check on your condition and your response to  treatment is very important. SEEK IMMEDIATE MEDICAL CARE IF:   You develop extreme weakness, shortness of breath, or chest pain.   You become dizzy or have trouble concentrating.  You develop heavy vaginal bleeding.   You develop a rash.   You have bloody or black, tarry stools.   You faint.   You vomit up blood.   You vomit repeatedly.   You have abdominal pain.  You have a fever or persistent symptoms for more than 2-3 days.   You have a fever and your symptoms suddenly get worse.   You are dehydrated.  MAKE SURE YOU:  Understand these instructions.  Will watch your condition.  Will get help right away if you are not doing well or get worse.   This information is not intended to replace advice given to you by your health care provider. Make sure you discuss any questions you have with your health care provider.   Document Released: 04/10/2004 Document Revised: 11/03/2012 Document Reviewed: 08/27/2012 Elsevier Interactive Patient Education 2016 [employer].  Fatigue Fatigue is feeling tired all of the time, a lack of energy, or a lack of motivation. Occasional or mild fatigue is often a normal response to activity or life in general. However, long-lasting (chronic) or extreme fatigue may indicate an underlying medical condition. HOME CARE INSTRUCTIONS  Watch your fatigue for any changes. The following actions may help to lessen any discomfort you are feeling:  Talk to your health care provider about how much sleep you need each night. Try to get the required amount every night.  Take medicines only as directed by your health care provider.  Eat  a healthy and nutritious diet. Ask your health care provider if you need help changing your diet.  Drink enough fluid to keep your urine clear or pale yellow.  Practice ways of relaxing, such as yoga, meditation, massage therapy, or acupuncture.  Exercise regularly.   Change situations that cause you  stress. Try to keep your work and personal routine reasonable.  Do not abuse illegal drugs.  Limit alcohol intake to no more than 1 drink per day for nonpregnant women and 2 drinks per day for men. One drink equals 12 ounces of beer, 5 ounces of wine, or 1 ounces of hard liquor.  Take a multivitamin, if directed by your health care provider. SEEK MEDICAL CARE IF:   Your fatigue does not get better.  You have a fever.   You have unintentional weight loss or gain.  You have headaches.   You have difficulty:   Falling asleep.  Sleeping throughout the night.  You feel angry, guilty, anxious, or sad.   You are unable to have a bowel movement (constipation).   You skin is dry.   Your legs or another part of your body is swollen.  SEEK IMMEDIATE MEDICAL CARE IF:   You feel confused.   Your vision is blurry.  You feel faint or pass out.   You have a severe headache.   You have severe abdominal, pelvic, or back pain.   You have chest pain, shortness of breath, or an irregular or fast heartbeat.   You are unable to urinate or you urinate less than normal.   You develop abnormal bleeding, such as bleeding from the rectum, vagina, nose, lungs, or nipples.  You vomit blood.   You have thoughts about harming yourself or committing suicide.   You are worried that you might harm someone else.    This information is not intended to replace advice given to you by your health care provider. Make sure you discuss any questions you have with your health care provider.   Document Released: 12/29/2006 Document Revised: 03/24/2014 Document Reviewed: 07/05/2013 Elsevier Interactive Patient Education 2016 Elsevier Inc.  Paresthesia Paresthesia is a burning or prickling feeling. This feeling can happen in any part of the body. It often happens in the hands, arms, legs, or feet. Usually, it is not painful. In most cases, the feeling goes away in a short time and  is not a sign of a serious problem. HOME CARE  Avoid drinking alcohol.  Try massage or needle therapy (acupuncture) to help with your problems.  Keep all follow-up visits as told by your doctor. This is important. GET HELP IF:  You keep on having episodes of paresthesia.  Your burning or prickling feeling gets worse when you walk.  You have pain or cramps.  You feel dizzy.  You have a rash. GET HELP RIGHT AWAY IF:  You feel weak.  You have trouble walking or moving.  You have problems speaking, understanding, or seeing.  You feel confused.  You cannot control when you pee (urinate) or poop (bowel movement).  You lose feeling (numbness) after an injury.  You pass out (faint).   This information is not intended to replace advice given to you by your health care provider. Make sure you discuss any questions you have with your health care provider.   Document Released: 02/14/2008 Document Revised: 07/18/2014 Document Reviewed: 02/27/2014 Elsevier Interactive Patient Education [employer].

## 2015-04-09 NOTE — ED Notes (Signed)
Reports headaches are improving.  Patient concerned that urine looks different.  Reports vaginal discharge " only with urine", and has low abdominal pain.

## 2015-04-16 ENCOUNTER — Telehealth (HOSPITAL_COMMUNITY): Payer: Self-pay | Admitting: Interventional Radiology

## 2015-04-16 NOTE — Telephone Encounter (Signed)
Returned pt's call. She wanted to know when her next follow-up was due and that she needed a refill on her Plavix. I gave a note to Clarise Cruz to call in script and told her that she would be due in May 2017 for f/u. Pt states understanding and is in agreement with this. JM

## 2015-04-17 ENCOUNTER — Telehealth (HOSPITAL_COMMUNITY): Payer: Self-pay | Admitting: *Deleted

## 2015-04-17 NOTE — Telephone Encounter (Signed)
Called refill foo Plavix 75mg  1 tablet daily, 30 tablets and 3 refills

## 2015-04-17 NOTE — Telephone Encounter (Signed)
Called patient and let  Her know that her refill for Plavix was called in

## 2015-10-23 ENCOUNTER — Telehealth (HOSPITAL_COMMUNITY): Payer: Self-pay

## 2015-10-23 NOTE — Telephone Encounter (Signed)
Called to update insurance information so that we can schedule f/u mri, left message for pt to call back. AW

## 2015-10-30 ENCOUNTER — Telehealth (HOSPITAL_COMMUNITY): Payer: Self-pay

## 2015-10-30 NOTE — Telephone Encounter (Signed)
Pt stated that she is still waiting on medicaid and does not have any insurance right now. Will give [place] a call when she wants to schedule. AW

## 2016-05-22 ENCOUNTER — Other Ambulatory Visit (HOSPITAL_COMMUNITY): Payer: Self-pay

## 2016-05-22 ENCOUNTER — Other Ambulatory Visit (HOSPITAL_COMMUNITY): Payer: Self-pay | Admitting: Radiology

## 2016-05-22 DIAGNOSIS — I671 Cerebral aneurysm, nonruptured: Secondary | ICD-10-CM

## 2016-05-22 LAB — PLATELET INHIBITION P2Y12: PLATELET FUNCTION P2Y12: 254 [PRU] (ref 194–418)

## 2016-05-28 ENCOUNTER — Telehealth (HOSPITAL_COMMUNITY): Payer: Self-pay | Admitting: *Deleted

## 2016-05-28 NOTE — Telephone Encounter (Signed)
Called and spoke with patient. Per Dr. Estanislado Pandy pt is to stop taking Plavix.  And she is to continue taking ASA 325mg  Daily.  Pt verbalized understanding

## 2016-06-02 ENCOUNTER — Telehealth (HOSPITAL_COMMUNITY): Payer: Self-pay

## 2016-06-02 NOTE — Telephone Encounter (Signed)
Called to inform her that her insurance had termed as of 05/27/16. She stated that she will call and get it renewed and give [place] a call back when that is done. AW

## 2016-06-30 ENCOUNTER — Telehealth (HOSPITAL_COMMUNITY): Payer: Self-pay

## 2016-06-30 ENCOUNTER — Other Ambulatory Visit (HOSPITAL_COMMUNITY): Payer: Self-pay | Admitting: Interventional Radiology

## 2016-06-30 DIAGNOSIS — I729 Aneurysm of unspecified site: Secondary | ICD-10-CM

## 2016-06-30 NOTE — Telephone Encounter (Signed)
Called to schedule mri, left message for pt to return call. AW 

## 2016-07-08 ENCOUNTER — Encounter (HOSPITAL_COMMUNITY): Payer: Self-pay

## 2016-07-08 ENCOUNTER — Encounter (HOSPITAL_COMMUNITY): Payer: Self-pay | Admitting: Radiology

## 2016-07-08 ENCOUNTER — Ambulatory Visit (HOSPITAL_COMMUNITY)
Admission: RE | Admit: 2016-07-08 | Discharge: 2016-07-08 | Disposition: A | Discharge status: Home / Self Care | Payer: Self-pay | Source: Ambulatory Visit | Attending: Interventional Radiology | Admitting: Interventional Radiology

## 2016-07-08 ENCOUNTER — Ambulatory Visit (HOSPITAL_COMMUNITY): Payer: Self-pay

## 2016-07-08 DIAGNOSIS — Z09 Encounter for follow-up examination after completed treatment for conditions other than malignant neoplasm: Secondary | ICD-10-CM | POA: Insufficient documentation

## 2016-07-08 DIAGNOSIS — Z8679 Personal history of other diseases of the circulatory system: Secondary | ICD-10-CM | POA: Insufficient documentation

## 2016-07-08 DIAGNOSIS — Z95828 Presence of other vascular implants and grafts: Secondary | ICD-10-CM | POA: Insufficient documentation

## 2016-07-08 DIAGNOSIS — Z9889 Other specified postprocedural states: Secondary | ICD-10-CM | POA: Insufficient documentation

## 2016-07-08 DIAGNOSIS — I729 Aneurysm of unspecified site: Secondary | ICD-10-CM

## 2016-07-08 MED ORDER — GADOBENATE DIMEGLUMINE 529 MG/ML IV SOLN
20.0000 mL | Freq: Once | INTRAVENOUS | Status: AC
Start: 2016-07-08 — End: 2016-07-08
  Administered 2016-07-08: 17 mL via INTRAVENOUS

## 2016-07-22 ENCOUNTER — Telehealth (HOSPITAL_COMMUNITY): Payer: Self-pay

## 2016-07-22 NOTE — Telephone Encounter (Signed)
Pt agreed to f/u in 6 months with mri/mra. AW  

## 2017-06-29 ENCOUNTER — Telehealth (HOSPITAL_COMMUNITY): Payer: Self-pay

## 2017-06-29 NOTE — Telephone Encounter (Signed)
Called to schedule 1 yr f/u mri/mra, no answer, left vm. AW

## 2017-06-30 ENCOUNTER — Other Ambulatory Visit (HOSPITAL_COMMUNITY): Payer: Self-pay | Admitting: Interventional Radiology

## 2017-06-30 DIAGNOSIS — I729 Aneurysm of unspecified site: Secondary | ICD-10-CM

## 2017-07-13 ENCOUNTER — Ambulatory Visit (HOSPITAL_COMMUNITY): Payer: Self-pay

## 2017-07-13 ENCOUNTER — Ambulatory Visit (HOSPITAL_COMMUNITY)
Admission: RE | Admit: 2017-07-13 | Discharge: 2017-07-13 | Disposition: A | Discharge status: Home / Self Care | Payer: Self-pay | Source: Ambulatory Visit | Attending: Interventional Radiology | Admitting: Interventional Radiology

## 2017-07-13 DIAGNOSIS — I729 Aneurysm of unspecified site: Secondary | ICD-10-CM | POA: Insufficient documentation

## 2017-07-13 LAB — CREATININE, SERUM
CREATININE: 0.47 mg/dL (ref 0.44–1.00)
GFR calc Af Amer: >60 mL/min (ref >60)

## 2017-07-13 IMAGING — MR MR MRA HEAD W/O CM
10 of 13 series · 30 of 48 positions shown · IV contrast (multihance)
Comparison: [DATE] MRI and MRA of the head.

CLINICAL DATA: 42 y/o F; aneurysm pipeline placed [DATE] for
follow-up.

EXAM:
MRI HEAD WITHOUT AND WITH CONTRAST
MRA HEAD WITHOUT CONTRAST
TECHNIQUE: Multiplanar, multiecho pulse sequences of the brain and surrounding
structures were obtained without and with intravenous contrast.
Angiographic images of the head were obtained using MRA technique
without contrast.
CONTRAST:  17mL MULTIHANCE GADOBENATE DIMEGLUMINE 529 MG/ML IV SOLN

[Series 3: DWI · axial · 3.0mm · 1.09mm/px · z∈[-74,+82]mm · 6 of 106 slices shown (1 of 4)]
[im 1/106]
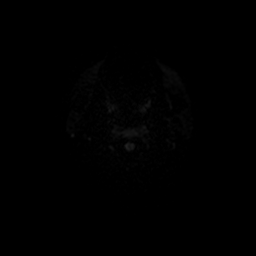
[im 22/106]
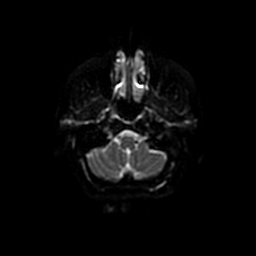
[im 43/106]
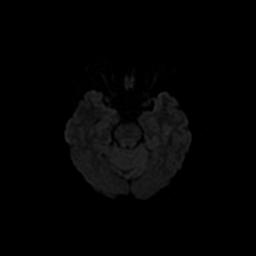
[im 64/106]
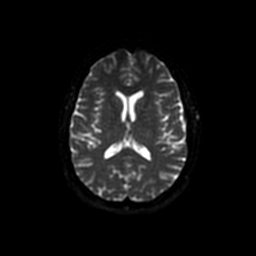
[im 85/106]
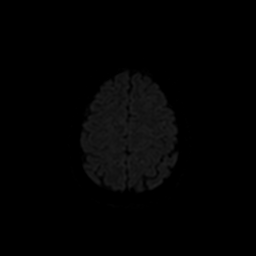
[im 106/106]
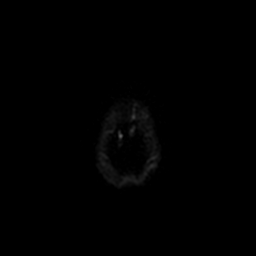

[Series 4: (id) mt fs · axial · 1.4mm · 0.43mm/px · z∈[-58,-31]mm · 3 of 136 slices shown]
[im 1/136]
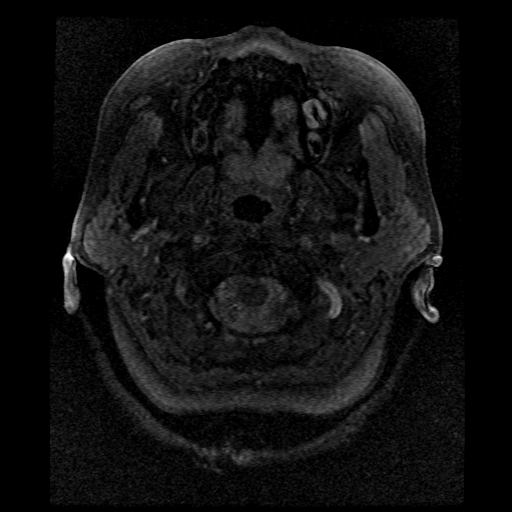
[im 20/136]
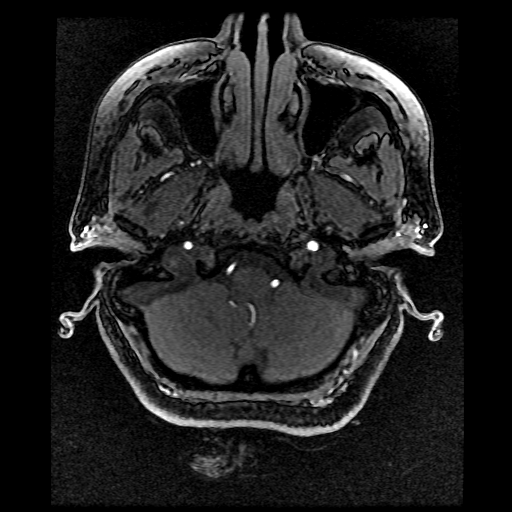
[im 39/136]
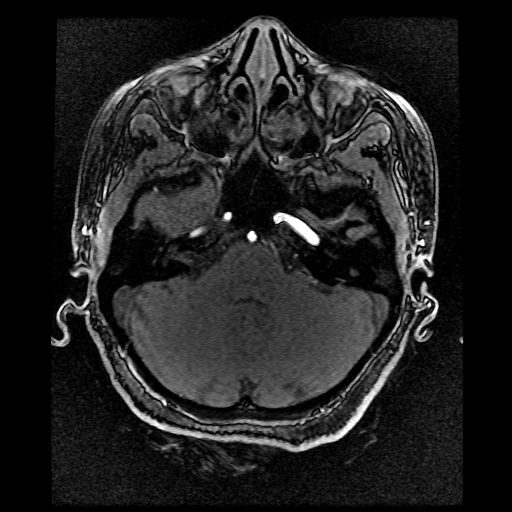

[Series 5: DWI · coronal · 5.0mm · 1.09mm/px · 5 of 76 slices shown (2 of 4)]
[im 1/76]
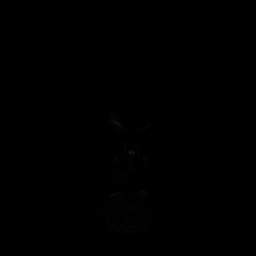
[im 19/76]
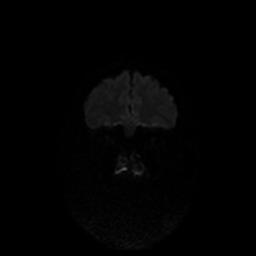
[im 38/76]
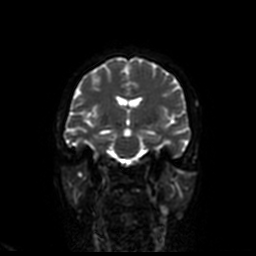
[im 57/76]
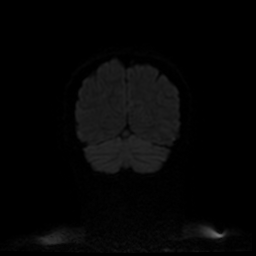
[im 76/76]
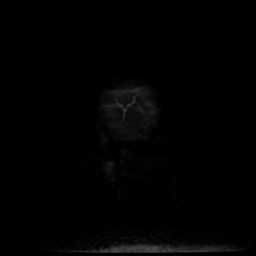

[Series 6: T1 · sagittal · 5.0mm · 0.47mm/px · 2 of 23 slices shown]
[im 1/23]
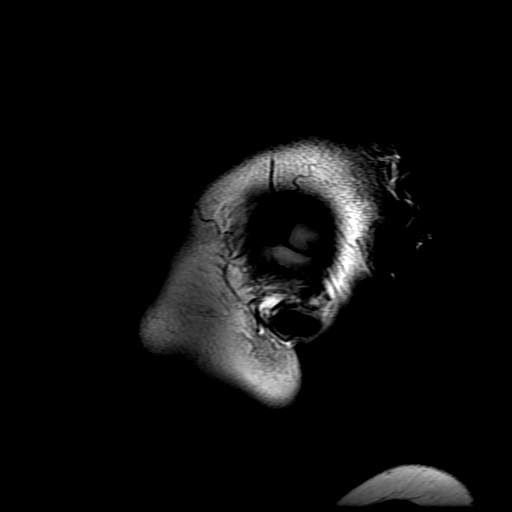
[im 23/23]
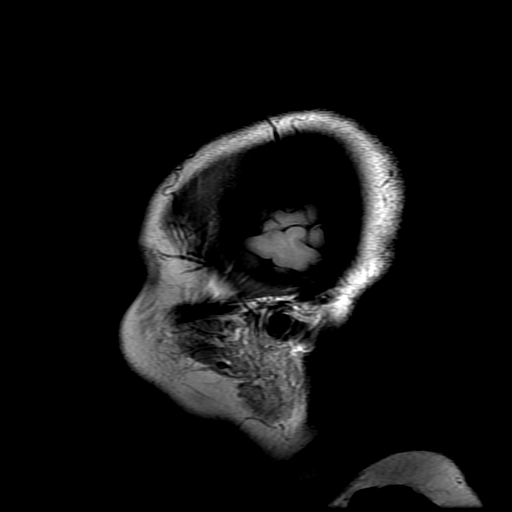

[Series 7: T2 · axial · 5.0mm · 0.43mm/px · z∈[-73,+77]mm · 2 of 26 slices shown]
[im 1/26]
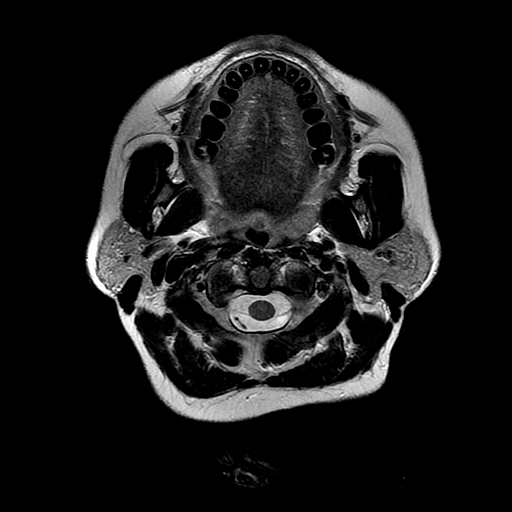
[im 26/26]
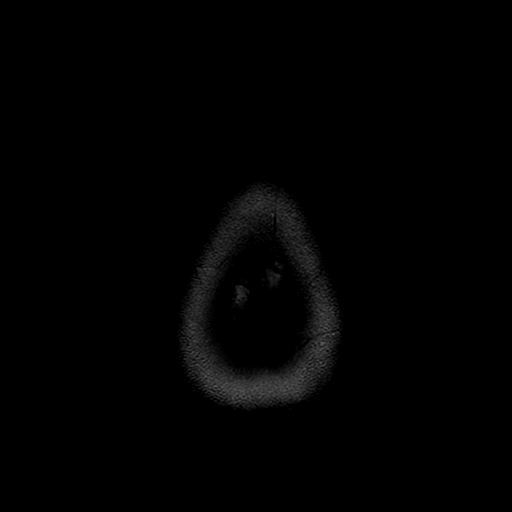

[Series 9: FLAIR · axial · 3.0mm · 0.43mm/px · z∈[-73,+77]mm · 2 of 26 slices shown]
[im 1/26]
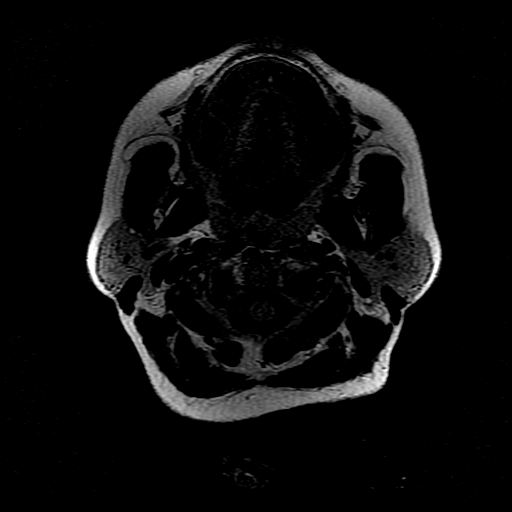
[im 26/26]
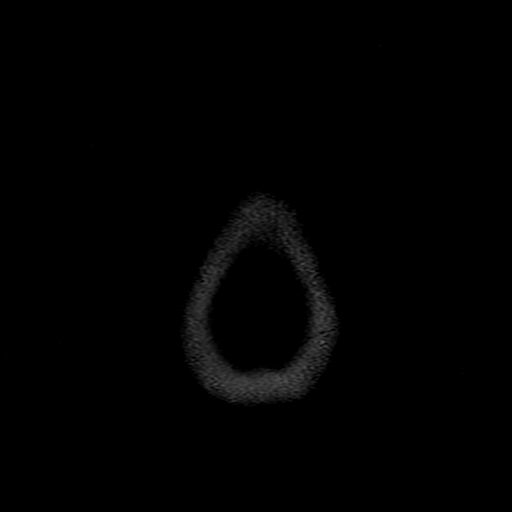

[Series 11: T2 post-contrast · coronal · 5.0mm · 0.39mm/px · 2 of 25 slices shown]
[im 1/25]
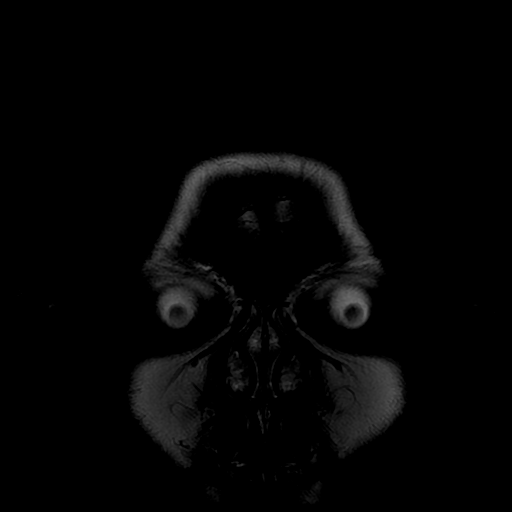
[im 25/25]
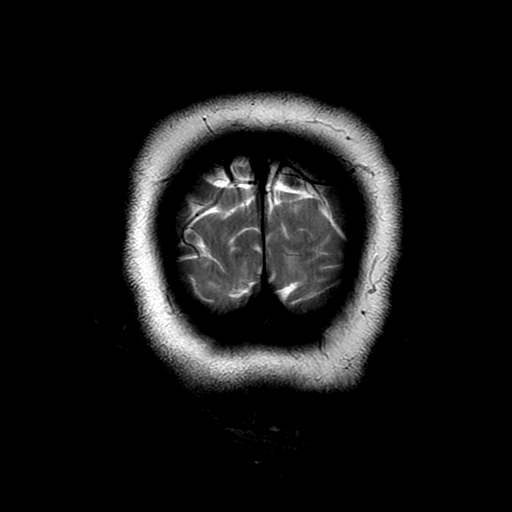

[Series 13: T1 post-contrast · coronal · 5.0mm · 0.39mm/px · 2 of 25 slices shown]
[im 1/25]
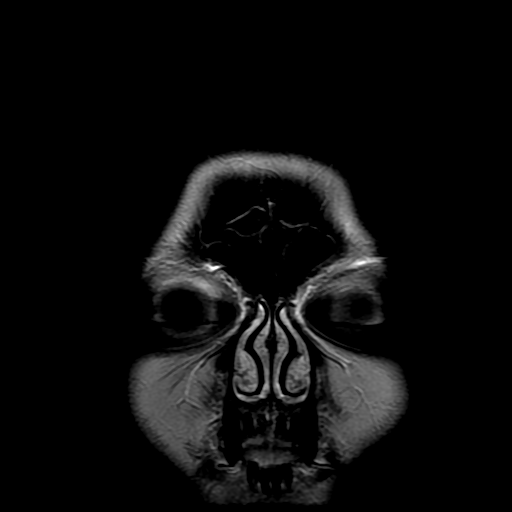
[im 25/25]
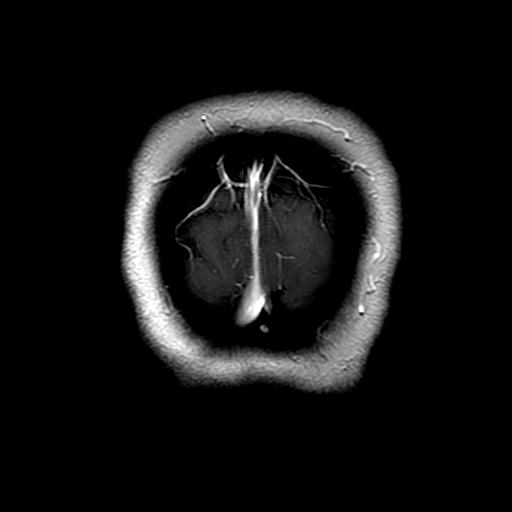

[Series 300: DWI · axial · 3.0mm · 1.09mm/px · z∈[-74,+82]mm · 4 of 53 slices shown (3 of 4)]
[im 1/53]
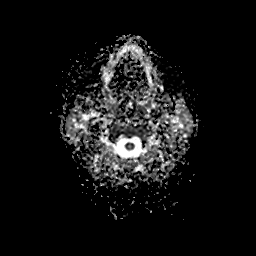
[im 18/53]
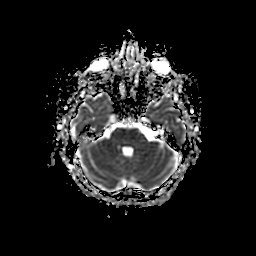
[im 35/53]
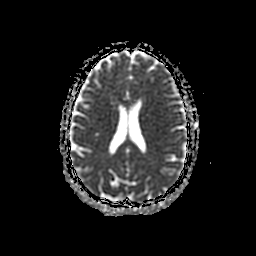
[im 53/53]
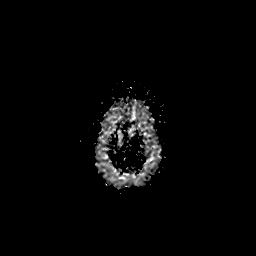

[Series 500: DWI · coronal · 5.0mm · 1.09mm/px · 2 of 36 slices shown (4 of 4)]
[im 1/36]
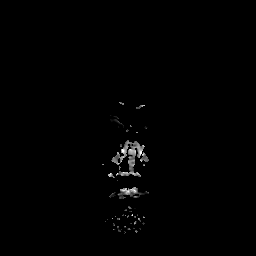
[im 36/36]
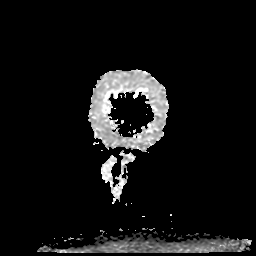

[30 of 48 positions shown; findings below may reference images not displayed]

FINDINGS: MRI HEAD FINDINGS

Brain: No acute infarction, hemorrhage, hydrocephalus, extra-axial
collection or mass lesion. No abnormal enhancement. Stable mild
chronic microvascular ischemic changes of the brain. Scattered foci
of chronic microhemorrhage in the brain or less conspicuous when
compared with prior MRI given differences in technique.

Vascular: Normal flow voids.

Skull and upper cervical spine: Normal marrow signal.

Sinuses/Orbits: Mild left maxillary sinus mucosal thickening. No
abnormal signal of mastoid air cells. Orbits are unremarkable.

Other: None.

MRA HEAD FINDINGS

Internal carotid arteries: Endovascular stents present within
bilateral carotid arteries involving right ICA from distal cavernous
to paraclinoid segment and left-sided ICA from ophthalmic to
paraclinoid segment. Normal flow related signal within the stent
lumen. No recurrent or residual aneurysm identified.

Anterior cerebral arteries:  Patent.

Middle cerebral arteries: Patent.

Anterior communicating artery: Patent.

Posterior communicating arteries:  Patent.

Posterior cerebral arteries:  Patent.

Basilar artery:  Patent.

Vertebral arteries:  Patent.

No evidence of high-grade stenosis, large vessel occlusion, or new
aneurysm.
IMPRESSION: 1. Stable patent stented bilateral ICA. No residual or recurrent
aneurysm identified.
2. Patent anterior and posterior intracranial circulation. No large
vessel occlusion, new aneurysm, or significant stenosis is
identified.
3. Stable mild chronic microvascular ischemic changes of the brain.
4. No acute intracranial process or abnormal enhancement of the
brain.

By: MARCUS M.D.

## 2017-07-13 MED ORDER — GADOBENATE DIMEGLUMINE 529 MG/ML IV SOLN
17.0000 mL | Freq: Once | INTRAVENOUS | Status: AC
  Administered 2017-07-13: 17 mL via INTRAVENOUS

## 2017-07-16 ENCOUNTER — Telehealth (HOSPITAL_COMMUNITY): Payer: Self-pay

## 2017-07-16 NOTE — Telephone Encounter (Signed)
Pt called regarding her recent MRI. Per Dr. Estanislado Pandy, he doesn't need to follow her any longer. If she starts to develop any sx then please give [place] a call. She wanted to know if it was ok to fly to [place]. I informed her that Dr. Estanislado Pandy said that it is fine to fly. AW

## 2017-09-24 NOTE — Telephone Encounter (Signed)
Attempted phone call

## 2019-02-23 ENCOUNTER — Other Ambulatory Visit: Payer: Self-pay

## 2019-02-23 DIAGNOSIS — Z20822 Contact with and (suspected) exposure to covid-19: Secondary | ICD-10-CM

## 2019-02-25 LAB — NOVEL CORONAVIRUS, NAA: SARS-CoV-2, NAA: DETECTED — AB

## 2019-03-15 ENCOUNTER — Ambulatory Visit: Payer: MEDICAID | Attending: Internal Medicine

## 2019-03-15 DIAGNOSIS — Z20822 Contact with and (suspected) exposure to covid-19: Secondary | ICD-10-CM

## 2019-03-16 LAB — NOVEL CORONAVIRUS, NAA: SARS-CoV-2, NAA: NOT DETECTED

## 2019-05-12 ENCOUNTER — Ambulatory Visit: Payer: Self-pay

## 2019-06-01 ENCOUNTER — Ambulatory Visit: Payer: MEDICAID | Attending: Internal Medicine

## 2019-06-01 DIAGNOSIS — Z23 Encounter for immunization: Secondary | ICD-10-CM

## 2019-06-01 NOTE — Progress Notes (Signed)
   Covid-19 Vaccination Clinic  Name:  Holly Gray    MRN: [id number] DOB: 25-Aug-1975  06/01/2019  [name] was observed post Covid-19 immunization for 15 minutes without incident. She was provided with Vaccine Information Sheet and instruction to access the V-Safe system.   [name] was instructed to call 911 with any severe reactions post vaccine: Marland Kitchen Difficulty breathing  . Swelling of face and throat  . A fast heartbeat  . A bad rash all over body  . Dizziness and weakness   Immunizations Administered    Name Date Dose VIS Date Route   Pfizer COVID-19 Vaccine 06/01/2019  3:53 PM 0.3 mL 02/25/2019 Intramuscular   Manufacturer: [place]   Lot: [id number]   [place]: [id number]

## 2019-06-13 ENCOUNTER — Ambulatory Visit: Payer: Self-pay

## 2019-08-25 ENCOUNTER — Emergency Department (HOSPITAL_COMMUNITY)
Admission: AD | Admit: 2019-08-25 | Discharge: 2019-08-25 | Disposition: A | Discharge status: Home / Self Care | Payer: Self-pay | Attending: Emergency Medicine | Admitting: Emergency Medicine

## 2019-08-25 ENCOUNTER — Encounter (HOSPITAL_COMMUNITY): Payer: Self-pay | Admitting: Obstetrics and Gynecology

## 2019-08-25 ENCOUNTER — Emergency Department (HOSPITAL_COMMUNITY): Payer: Self-pay

## 2019-08-25 ENCOUNTER — Other Ambulatory Visit (HOSPITAL_COMMUNITY): Payer: Self-pay

## 2019-08-25 ENCOUNTER — Other Ambulatory Visit: Payer: Self-pay

## 2019-08-25 DIAGNOSIS — Z7982 Long term (current) use of aspirin: Secondary | ICD-10-CM | POA: Insufficient documentation

## 2019-08-25 DIAGNOSIS — R102 Pelvic and perineal pain unspecified side: Secondary | ICD-10-CM

## 2019-08-25 DIAGNOSIS — N939 Abnormal uterine and vaginal bleeding, unspecified: Secondary | ICD-10-CM

## 2019-08-25 LAB — CBC WITH DIFFERENTIAL/PLATELET
Abs Immature Granulocytes: 0.04 10*3/uL (ref 0.00–0.07)
Basophils Absolute: 0 10*3/uL (ref 0.0–0.1)
Basophils Relative: 0 %
Eosinophils Absolute: 0 10*3/uL (ref 0.0–0.5)
Eosinophils Relative: 0 %
HCT: 29 % — ABNORMAL LOW (ref 36.0–46.0)
Hemoglobin: 8.9 g/dL — ABNORMAL LOW (ref 12.0–15.0)
Immature Granulocytes: 0 %
Lymphocytes Relative: 11 %
Lymphs Abs: 1 10*3/uL (ref 0.7–4.0)
MCH: 26.6 pg (ref 26.0–34.0)
MCHC: 30.7 g/dL (ref 30.0–36.0)
MCV: 86.6 fL (ref 80.0–100.0)
Monocytes Absolute: 0.8 10*3/uL (ref 0.1–1.0)
Monocytes Relative: 9 %
Neutro Abs: 7.9 10*3/uL — ABNORMAL HIGH (ref 1.7–7.7)
Neutrophils Relative %: 80 %
Platelets: 224 10*3/uL (ref 150–400)
RBC: 3.35 MIL/uL — ABNORMAL LOW (ref 3.87–5.11)
RDW: 14.8 % (ref 11.5–15.5)
WBC: 9.9 10*3/uL (ref 4.0–10.5)
nRBC: 0 % (ref 0.0–0.2)

## 2019-08-25 LAB — WET PREP, GENITAL
Clue Cells Wet Prep HPF POC: NONE SEEN
Sperm: NONE SEEN
Trich, Wet Prep: NONE SEEN
Yeast Wet Prep HPF POC: NONE SEEN

## 2019-08-25 LAB — I-STAT BETA HCG BLOOD, ED (MC, WL, AP ONLY): I-stat hCG, quantitative: 12.1 m[IU]/mL — ABNORMAL HIGH (ref <5)

## 2019-08-25 LAB — POCT PREGNANCY, URINE: Preg Test, Ur: NEGATIVE

## 2019-08-25 MED ORDER — KETOROLAC TROMETHAMINE 30 MG/ML IJ SOLN
30.0000 mg | Freq: Once | INTRAMUSCULAR | Status: AC
  Administered 2019-08-25: 30 mg via INTRAVENOUS
  Filled 2019-08-25: qty 1

## 2019-08-25 MED ORDER — ONDANSETRON HCL 4 MG/2ML IJ SOLN
4.0000 mg | Freq: Once | INTRAMUSCULAR | Status: AC
  Administered 2019-08-25: 4 mg via INTRAVENOUS
  Filled 2019-08-25: qty 2

## 2019-08-25 MED ORDER — KETOROLAC TROMETHAMINE 30 MG/ML IJ SOLN: 30.0000 mg | Freq: Once | INTRAMUSCULAR | Status: DC

## 2019-08-25 MED ORDER — HYDROCODONE-ACETAMINOPHEN 5-325 MG PO TABS: 1.0000 | ORAL_TABLET | Freq: Four times a day (QID) | ORAL | 0 refills | Status: DC | PRN

## 2019-08-25 MED ORDER — FENTANYL CITRATE (PF) 100 MCG/2ML IJ SOLN
50.0000 ug | Freq: Once | INTRAMUSCULAR | Status: AC
  Administered 2019-08-25: 50 ug via INTRAVENOUS
  Filled 2019-08-25: qty 2

## 2019-08-25 MED ORDER — FERROUS SULFATE 325 (65 FE) MG PO TABS: 325.0000 mg | ORAL_TABLET | Freq: Every day | ORAL | 0 refills | Status: AC

## 2019-08-25 NOTE — Progress Notes (Signed)
Pt transferred to [facility] ED via wheelchair by hospital transport.

## 2019-08-25 NOTE — ED Provider Notes (Signed)
[facility] EMERGENCY DEPARTMENT Provider Note   CSN: [id number] Arrival date & time: 08/25/19  0814     History Chief Complaint  Patient presents with   Vaginal Bleeding    Holly Gray is a 44 y.o. female past with history of anemia, brain aneurysm, cerebral aneurysm who presents for evaluation of vaginal bleeding that began last night.  She reports that her last menstrual cycle was about 1 month ago.  Started having vaginal bleeding last night.  She reports that since then, she has gone through about a pad an hour and states that they are fully soaked.  She does have a history of heavy menstrual cycles but feels like this 1 is different.  She feels like it is more painful, more severe.  She states she has been passing very large clots.  States she has had some abdominal cramping that radiates into her pelvis area.  She states she has not had any dysuria.  She denies any fevers, nausea/vomiting.  He does have a history of anemia and states she is post to be on iron supplements.  She reports that sometimes she takes them and sometimes she does not.  She is currently sexually active with one partner.  They do not use protection.  The history is provided by the patient.       Past Medical History:  Diagnosis Date   Anemia    Anxiety    Brain aneurysm    Bronchitis    hx of   Cerebral aneurysm, nonruptured    PT D/C on 01-16-15   Stroke [facility])    ?    Varicose veins     Patient Active Problem List   Diagnosis Date Noted   Cerebral aneurysm, nonruptured    Headache    Aneurysm, cerebral, nonruptured    Menorrhagia with regular cycle 12/07/2014   Brain aneurysm 10/30/2014    Past Surgical History:  Procedure Laterality Date   CEREBRAL ANEURYSM REPAIR Left    01-16-15   CESAREAN SECTION     CYST EXCISION     groin left   RADIOLOGY WITH ANESTHESIA N/A 10/30/2014   Procedure: RADIOLOGY WITH ANESTHESIA;  Surgeon: Luanne Bras, MD;   Location: [place];  Service: Radiology;  Laterality: N/A;   RADIOLOGY WITH ANESTHESIA N/A 01/15/2015   Procedure: ANEURYSM EMBOLIZATION;  Surgeon: Luanne Bras, MD;  Location: [place];  Service: Radiology;  Laterality: N/A;     OB History    Gravida  3   Para  3   Term  3   Preterm      AB      Living  3     SAB      TAB      Ectopic      Multiple      Live Births              Family History  Problem Relation Age of Onset   Hypertension Mother     Social History   Tobacco Use   Smoking status: Never Smoker   Smokeless tobacco: Never Used  Substance Use Topics   Alcohol use: No   Drug use: No    Home Medications Prior to Admission medications   Medication Sig Start Date End Date Taking? Authorizing Provider  aspirin 325 MG tablet Take 325 mg by mouth at bedtime.   Yes [provider]  Polyethyl Glycol-Propyl Glycol (SYSTANE OP) Place 1 drop into both eyes 2 (two)  times daily as needed (dry eyes).   Yes [provider]  clopidogrel (PLAVIX) 75 MG tablet Take 1 tablet (75 mg total) by mouth daily. Patient not taking: Reported on 08/25/2019 01/16/15   Tsosie Billing D, PA-C  ferrous sulfate 325 (65 FE) MG tablet Take 1 tablet (325 mg total) by mouth daily. 08/25/19   Volanda Napoleon, PA-C  HYDROcodone-acetaminophen (NORCO/VICODIN) 5-325 MG tablet Take 1-2 tablets by mouth every 6 (six) hours as needed. 08/25/19   Volanda Napoleon, PA-C    Allergies    Mango flavor  Review of Systems   Review of Systems  Constitutional: Negative for fever.  Respiratory: Negative for cough and shortness of breath.   Cardiovascular: Negative for chest pain.  Gastrointestinal: Negative for abdominal pain, nausea and vomiting.  Genitourinary: Positive for vaginal bleeding and vaginal pain. Negative for dysuria and vaginal discharge.  Neurological: Negative for headaches.  All other systems reviewed and are negative.   Physical Exam Updated  Vital Signs BP 116/74 (BP Location: Right Arm)    Pulse 81    Temp 98.6 F (37 C) (Oral)    Resp 20    Ht 5\' 5"  (1.651 m)    Wt 83.5 kg    SpO2 100%    BMI 30.62 kg/m   Physical Exam Vitals and nursing note reviewed. Exam conducted with a chaperone present.  Constitutional:      Appearance: Normal appearance. She is well-developed.  HENT:     Head: Normocephalic and atraumatic.  Eyes:     General: Lids are normal.     Conjunctiva/sclera: Conjunctivae normal.     Pupils: Pupils are equal, round, and reactive to light.  Cardiovascular:     Rate and Rhythm: Normal rate and regular rhythm.     Pulses: Normal pulses.     Heart sounds: Normal heart sounds. No murmur heard.  No friction rub. No gallop.   Pulmonary:     Effort: Pulmonary effort is normal.     Breath sounds: Normal breath sounds.  Abdominal:     Palpations: Abdomen is soft. Abdomen is not rigid.     Tenderness: There is abdominal tenderness in the suprapubic area. There is no guarding.     Comments: Mild suprapubic abdominal tenderness.  No rigidity, guarding.  Genitourinary:    Cervix: Cervical bleeding present.     Comments: The exam was performed with a chaperone present. Normal external female genitalia. No lesions, rash, or sores.  Large amount of blood in the vaginal vault with some clot noted.  This was removed.  No active sign of hemorrhage.  There is some bleeding from the cervical os.  No CMT.  She did have some right adnexal tenderness.  No mass.  No left adnexal mass or tenderness. Musculoskeletal:        General: Normal range of motion.     Cervical back: Full passive range of motion without pain.  Skin:    General: Skin is warm and dry.     Capillary Refill: Capillary refill takes less than 2 seconds.  Neurological:     Mental Status: She is alert and oriented to person, place, and time.  Psychiatric:        Speech: Speech normal.     ED Results / Procedures / Treatments   Labs (all labs ordered are  listed, but only abnormal results are displayed) Labs Reviewed  WET PREP, GENITAL - Abnormal; Notable for the following components:  Result Value   WBC, Wet Prep HPF POC MANY (*)    All other components within normal limits  CBC WITH DIFFERENTIAL/PLATELET - Abnormal; Notable for the following components:   RBC 3.35 (*)    Hemoglobin 8.9 (*)    HCT 29.0 (*)    Neutro Abs 7.9 (*)    All other components within normal limits  I-STAT BETA HCG BLOOD, ED ([facility], [facility], [facility] ONLY) - Abnormal; Notable for the following components:   I-stat hCG, quantitative 12.1 (*)    All other components within normal limits  POCT PREGNANCY, URINE  GC/CHLAMYDIA PROBE AMP ([place]) NOT AT [facility]    EKG None  Radiology US PELVIC COMPLETE WITH TRANSVAGINAL  Result Date: 08/25/2019 CLINICAL DATA:  Onset heavy vaginal bleeding last night. EXAM: TRANSABDOMINAL AND TRANSVAGINAL ULTRASOUND OF PELVIS TECHNIQUE: Both transabdominal and transvaginal ultrasound examinations of the pelvis were performed. Transabdominal technique was performed for global imaging of the pelvis including uterus, ovaries, adnexal regions, and pelvic cul-de-sac. It was necessary to proceed with endovaginal exam following the transabdominal exam to visualize the endometrium. COMPARISON:  None FINDINGS: Uterus Measurements: 10.7 x 7.2 x 9.9 cm = volume: 397.0 mL. No fibroids or other mass visualized. Endometrium Thickness: 0.9 cm.  No focal abnormality visualized. Right ovary Measurements: Not visualized. Left ovary Measurements: 4.9 x 2.6 x 3.8 cm = volume: There is 25.1 mL. A simple cyst measuring 4.0 x 2.0 x 2.5 cm is noted. Other findings No abnormal free fluid. IMPRESSION: No acute abnormality or finding to explain the patient's symptoms. The right ovary was not visualized. Simple left ovarian cyst.  No follow-up imaging is recommended. Electronically Signed   By: Inge Rise M.D.   On: 08/25/2019 13:10    Procedures Procedures (including  critical care time)  Medications Ordered in ED Medications  fentaNYL (SUBLIMAZE) injection 50 mcg (50 mcg Intravenous Given 08/25/19 1054)  ondansetron (ZOFRAN) injection 4 mg (4 mg Intravenous Given 08/25/19 1054)  ketorolac (TORADOL) 30 MG/ML injection 30 mg (30 mg Intravenous Given 08/25/19 1231)    ED Course  I have reviewed the triage vital signs and the nursing notes.  Pertinent labs & imaging results that were available during my care of the patient were reviewed by me and considered in my medical decision making (see chart for details).    MDM Rules/Calculators/A&P                          44 year old female past with history of anemia, aneurysm who presents for evaluation of vaginal bleeding that began last night.  History of heavy menstrual cycles but feels like this 1 is different.  She has had some cramping as well as pelvic pain.  On initially arrival, she is afebrile, nontoxic-appearing.  Vital signs are stable.  She has some mild suprapubic abdominal cramping.  Plan for labs, pelvic.  Consider abnormal vaginal bleeding.  She had a negative pregnancy test at the [facility] where she initially presented.  Pelvic exam as documented above.  She did have a large amount of bleeding in the vaginal vault.  This was cleared with a large Q-tip which showed no evidence of hemorrhage.  She did have some bleeding from the cervical os.  No CMT that would be concerning for PID.  She did have some right adnexal tenderness.  We will plan for ultrasound.  CBC shows hemoglobin of 8.9.  I reviewed her records.  She has had low hemoglobin  in the past.  Her i-STAT beta is 12.1 but she has a negative urine pregnancy test.  I suspect this is false elevation.  Ultrasound shows no acute abnormality.  She has a simple left ovarian cyst.  No other acute abnormalities.  Discussed results with patient.  At this time, she is hemodynamically stable.  She has had history of anemia before.  She is not currently taking  her iron pills.  We will represcribed iron pills.  Patient instructed to follow-up with OB/GYN at this time. At this time, patient exhibits no emergent life-threatening condition that require further evaluation in ED. Patient had ample opportunity for questions and discussion. All patient's questions were answered with full understanding. Strict return precautions discussed. Patient expresses understanding and agreement to plan.   Portions of this note were generated with [employer]. Dictation errors may occur despite best attempts at proofreading.   Final Clinical Impression(s) / ED Diagnoses Final diagnoses:  Pelvic pain  Abnormal uterine bleeding    Rx / DC Orders ED Discharge Orders         Ordered    ferrous sulfate 325 (65 FE) MG tablet  Daily     Discontinue  Reprint     08/25/19 1401    HYDROcodone-acetaminophen (NORCO/VICODIN) 5-325 MG tablet  Every 6 hours PRN     Discontinue  Reprint     08/25/19 1401           Volanda Napoleon, PA-C 08/25/19 1647    Noemi Chapel, MD 08/26/19 [phone]

## 2019-08-25 NOTE — Progress Notes (Signed)
Report called to [facility] ED charge RN. 

## 2019-08-25 NOTE — [facility] Provider Note (Addendum)
Patient Holly Gray is a 44 y.o. G3P3003  at [facility] here with complaints of heavy vaginal bleeding that started last night. She is passing clots. She has a history of heavy periods. She has not had a pap smear in 8 years. She is calm, non-distressed.  She denies vomiting, fever, dizziness.   She drove herself here.    Patient Vitals for the past 24 hrs:  BP Temp Temp src Pulse Resp SpO2 Height Weight  08/25/19 0842 119/79 99.2 F (37.3 C) Oral 100 20 100 % -- --  08/25/19 0837 -- -- -- -- -- -- 5\' 5"  (1.651 m) 83.5 kg    Assessment and Plan  -UPT is negative in MAU.  -Offered patient option to follow up at [facility] gyn; she would like to be seen in the ED.  -Report called to Dr. Sabra Heck; patient in stable condition for transport.  -Message sent to [facility] program to schedule patient for free pap and mammo due to lack of insurance.  Mervyn Skeeters Alli Jasmer 08/25/2019, 9:18 AM

## 2019-08-25 NOTE — MAU Note (Signed)
Presents with c/o VB that began last night. Reports VB is heavier than usual and passing large blood clots.  States changing sanitary napkin less than 2 hours.  Pt states unsure if pregnant, but doesn't think so.

## 2019-08-25 NOTE — Discharge Instructions (Signed)
Is alsoYou can take Tylenol or Ibuprofen as directed for pain. You can alternate Tylenol and Ibuprofen every 4 hours. If you take Tylenol at 1pm, then you can take Ibuprofen at 5pm. Then you can take Tylenol again at 9pm.   Take pain medications as directed for break through pain. Do not drive or operate machinery while taking this medication.   As we discussed, it is very important for you to restart.  Follow-up with the referred OB/GYN clinic.  This is also been arranged by the OB/GYN doctors that you saw today.  Return the emergency department for any worsening bleeding, pain  worsening or concerning symptoms.

## 2019-08-25 NOTE — ED Triage Notes (Addendum)
Having vaginal bleeding  "different than regular period, very painful, passing clots, severe pain in abd radiating down legs"  States last month's period was bad also, but not this bad.   Had [employer] Vaccine -- is concerned that vaccine has caused this.

## 2019-08-26 LAB — GC/CHLAMYDIA PROBE AMP (~~LOC~~) NOT AT ARMC
Chlamydia: NEGATIVE
Comment: NEGATIVE
Comment: NORMAL
Neisseria Gonorrhea: NEGATIVE

## 2020-04-05 ENCOUNTER — Encounter (HOSPITAL_COMMUNITY): Payer: Self-pay

## 2020-04-05 ENCOUNTER — Observation Stay (HOSPITAL_COMMUNITY)
Admission: EM | Admit: 2020-04-05 | Discharge: 2020-04-07 | Disposition: A | Discharge status: Home / Self Care | Payer: Self-pay | Attending: Cardiovascular Disease | Admitting: Cardiovascular Disease

## 2020-04-05 ENCOUNTER — Other Ambulatory Visit: Payer: Self-pay

## 2020-04-05 DIAGNOSIS — Z20822 Contact with and (suspected) exposure to covid-19: Secondary | ICD-10-CM | POA: Insufficient documentation

## 2020-04-05 DIAGNOSIS — Z7982 Long term (current) use of aspirin: Secondary | ICD-10-CM | POA: Insufficient documentation

## 2020-04-05 DIAGNOSIS — I249 Acute ischemic heart disease, unspecified: Secondary | ICD-10-CM | POA: Insufficient documentation

## 2020-04-05 DIAGNOSIS — R079 Chest pain, unspecified: Secondary | ICD-10-CM

## 2020-04-05 DIAGNOSIS — R0789 Other chest pain: Principal | ICD-10-CM | POA: Insufficient documentation

## 2020-04-05 DIAGNOSIS — R002 Palpitations: Secondary | ICD-10-CM | POA: Insufficient documentation

## 2020-04-05 NOTE — ED Triage Notes (Signed)
Pt here via [facility] for eval of "weird feeling" in chest, "can feel heart beating," EKG showed ST. Hx HTN, anxiety. VSS on arrival, EKG shows NSR. Pt endorses discomfort in chest, states it is 8/10.

## 2020-04-06 ENCOUNTER — Other Ambulatory Visit: Payer: Self-pay

## 2020-04-06 ENCOUNTER — Emergency Department (HOSPITAL_COMMUNITY): Payer: Self-pay

## 2020-04-06 ENCOUNTER — Observation Stay (HOSPITAL_COMMUNITY): Payer: Self-pay

## 2020-04-06 DIAGNOSIS — I249 Acute ischemic heart disease, unspecified: Secondary | ICD-10-CM | POA: Diagnosis present

## 2020-04-06 LAB — BASIC METABOLIC PANEL
Anion gap: 9 (ref 5–15)
BUN: 14 mg/dL (ref 6–20)
CO2: 24 mmol/L (ref 22–32)
Calcium: 9.1 mg/dL (ref 8.9–10.3)
Chloride: 106 mmol/L (ref 98–111)
Creatinine, Ser: 0.7 mg/dL (ref 0.44–1.00)
GFR, Estimated: >60 mL/min (ref >60)
Glucose, Bld: 169 mg/dL — ABNORMAL HIGH (ref 70–99)
Potassium: 3.3 mmol/L — ABNORMAL LOW (ref 3.5–5.1)
Sodium: 139 mmol/L (ref 135–145)

## 2020-04-06 LAB — CBC
HCT: 34.3 % — ABNORMAL LOW (ref 36.0–46.0)
Hemoglobin: 11.3 g/dL — ABNORMAL LOW (ref 12.0–15.0)
MCH: 29.1 pg (ref 26.0–34.0)
MCHC: 32.9 g/dL (ref 30.0–36.0)
MCV: 88.4 fL (ref 80.0–100.0)
Platelets: 214 10*3/uL (ref 150–400)
RBC: 3.88 MIL/uL (ref 3.87–5.11)
RDW: 14.1 % (ref 11.5–15.5)
WBC: 4.7 10*3/uL (ref 4.0–10.5)
nRBC: 0 % (ref 0.0–0.2)

## 2020-04-06 LAB — ECHOCARDIOGRAM COMPLETE
Area-P 1/2: 4.21 cm2
Height: 65 in
S' Lateral: 2.1 cm

## 2020-04-06 LAB — HIV ANTIBODY (ROUTINE TESTING W REFLEX): HIV Screen 4th Generation wRfx: NONREACTIVE

## 2020-04-06 LAB — TROPONIN I (HIGH SENSITIVITY)
Troponin I (High Sensitivity): 4 ng/L (ref <18)
Troponin I (High Sensitivity): 10 ng/L (ref <18)
Troponin I (High Sensitivity): 7 ng/L (ref <18)

## 2020-04-06 LAB — SARS CORONAVIRUS 2 BY RT PCR (HOSPITAL ORDER, PERFORMED IN ~~LOC~~ HOSPITAL LAB): SARS Coronavirus 2: NEGATIVE

## 2020-04-06 MED ORDER — HEPARIN BOLUS VIA INFUSION
4000.0000 [IU] | Freq: Once | INTRAVENOUS | Status: DC
  Filled 2020-04-06: qty 4000

## 2020-04-06 MED ORDER — TECHNETIUM TC 99M TETROFOSMIN IV KIT
32.0000 | PACK | Freq: Once | INTRAVENOUS | Status: AC | PRN
  Administered 2020-04-06: 32 via INTRAVENOUS

## 2020-04-06 MED ORDER — TECHNETIUM TC 99M TETROFOSMIN IV KIT
10.0000 | PACK | Freq: Once | INTRAVENOUS | Status: AC | PRN
  Administered 2020-04-06: 10 via INTRAVENOUS

## 2020-04-06 MED ORDER — ONDANSETRON HCL 4 MG/2ML IJ SOLN: 4.0000 mg | Freq: Four times a day (QID) | INTRAMUSCULAR | Status: DC | PRN

## 2020-04-06 MED ORDER — ASPIRIN EC 81 MG PO TBEC: 81.0000 mg | DELAYED_RELEASE_TABLET | Freq: Every day | ORAL | Status: DC

## 2020-04-06 MED ORDER — ACETAMINOPHEN 325 MG PO TABS
650.0000 mg | ORAL_TABLET | ORAL | Status: DC | PRN
  Administered 2020-04-06 (×2): 650 mg via ORAL
  Filled 2020-04-06 (×2): qty 2

## 2020-04-06 MED ORDER — ASPIRIN 81 MG PO CHEW
324.0000 mg | CHEWABLE_TABLET | ORAL | Status: AC
  Filled 2020-04-06: qty 4

## 2020-04-06 MED ORDER — REGADENOSON 0.4 MG/5ML IV SOLN: 0.4000 mg | Freq: Once | INTRAVENOUS | Status: AC

## 2020-04-06 MED ORDER — HEPARIN (PORCINE) 25000 UT/250ML-% IV SOLN
950.0000 [IU]/h | INTRAVENOUS | Status: DC
  Filled 2020-04-06: qty 250

## 2020-04-06 MED ORDER — ATORVASTATIN CALCIUM 40 MG PO TABS
40.0000 mg | ORAL_TABLET | Freq: Every day | ORAL | Status: DC
  Administered 2020-04-06 – 2020-04-07 (×2): 40 mg via ORAL
  Filled 2020-04-06 (×2): qty 1

## 2020-04-06 MED ORDER — METOPROLOL TARTRATE 12.5 MG HALF TABLET
12.5000 mg | ORAL_TABLET | Freq: Two times a day (BID) | ORAL | Status: DC
  Administered 2020-04-07: 12.5 mg via ORAL
  Filled 2020-04-06: qty 1

## 2020-04-06 MED ORDER — NITROGLYCERIN 0.4 MG SL SUBL: 0.4000 mg | SUBLINGUAL_TABLET | SUBLINGUAL | Status: DC | PRN

## 2020-04-06 MED ORDER — REGADENOSON 0.4 MG/5ML IV SOLN
INTRAVENOUS | Status: AC
  Administered 2020-04-06: 0.4 mg via INTRAVENOUS
  Filled 2020-04-06: qty 5

## 2020-04-06 MED ORDER — ASPIRIN 300 MG RE SUPP: 300.0000 mg | RECTAL | Status: AC

## 2020-04-06 MED ORDER — POTASSIUM CHLORIDE CRYS ER 10 MEQ PO TBCR
10.0000 meq | EXTENDED_RELEASE_TABLET | Freq: Two times a day (BID) | ORAL | Status: DC
  Administered 2020-04-06 – 2020-04-07 (×3): 10 meq via ORAL
  Filled 2020-04-06 (×6): qty 1

## 2020-04-06 NOTE — ED Provider Notes (Signed)
[facility] EMERGENCY DEPARTMENT Provider Note   CSN: [id number] Arrival date & time: 04/05/20  2309     History Chief Complaint  Patient presents with  . Tachycardia    Holly Gray is a 45 y.o. female.  Patient presents to the emergency department for evaluation of chest discomfort.  Orts that she had onset of moderate to severe discomfort in the left chest associated with pounding heartbeat.  Patient cannot describe the pain any further.  She reports that she has been experiencing these symptoms intermittently with exertion in the past.  Tonight, however, she was sitting at rest when the symptoms occurred.  Patient has taken aspirin 325 mg tonight.        Past Medical History:  Diagnosis Date  . Anemia   . Anxiety   . Brain aneurysm   . Bronchitis    hx of  . Cerebral aneurysm, nonruptured    PT D/C on 01-16-15  . Stroke [facility])    ?   . Varicose veins     Patient Active Problem List   Diagnosis Date Noted  . Cerebral aneurysm, nonruptured   . Headache   . Aneurysm, cerebral, nonruptured   . Menorrhagia with regular cycle 12/07/2014  . Brain aneurysm 10/30/2014    Past Surgical History:  Procedure Laterality Date  . CEREBRAL ANEURYSM REPAIR Left    01-16-15  . CESAREAN SECTION    . CYST EXCISION     groin left  . RADIOLOGY WITH ANESTHESIA N/A 10/30/2014   Procedure: RADIOLOGY WITH ANESTHESIA;  Surgeon: Julieanne Cotton, MD;  [facility];  Service: Radiology;  Laterality: N/A;  . RADIOLOGY WITH ANESTHESIA N/A 01/15/2015   Procedure: ANEURYSM EMBOLIZATION;  Surgeon: Julieanne Cotton, MD;  [facility] OR;  Service: Radiology;  Laterality: N/A;     OB History    Gravida  3   Para  3   Term  3   Preterm      AB      Living  3     SAB      IAB      Ectopic      Multiple      Live Births              Family History  Problem Relation Age of Onset  . Hypertension Mother     Social History   Tobacco Use  .  Smoking status: Never Smoker  . Smokeless tobacco: Never Used  Substance Use Topics  . Alcohol use: No  . Drug use: No    Home Medications Prior to Admission medications   Medication Sig Start Date End Date Taking? Authorizing Provider  aspirin 325 MG tablet Take 325 mg by mouth at bedtime.   Yes [provider]  Polyethyl Glycol-Propyl Glycol (SYSTANE OP) Place 1 drop into both eyes 2 (two) times daily as needed (dry eyes).   Yes [provider]  ferrous sulfate 325 (65 FE) MG tablet Take 1 tablet (325 mg total) by mouth daily. 08/25/19   Maxwell Caul, PA-C    Allergies    Mango flavor  Review of Systems   Review of Systems  Cardiovascular: Positive for chest pain and palpitations.  All other systems reviewed and are negative.   Physical Exam Updated Vital Signs BP 130/80 (BP Location: Right Arm)   Pulse 80   Temp 97.6 F (36.4 C) (Oral)   Resp 13   SpO2 100%   Physical Exam  Vitals and nursing note reviewed.  Constitutional:      General: She is not in acute distress.    Appearance: Normal appearance. She is well-developed and well-nourished.  HENT:     Head: Normocephalic and atraumatic.     Right Ear: Hearing normal.     Left Ear: Hearing normal.     Nose: Nose normal.     Mouth/Throat:     Mouth: Oropharynx is clear and moist and mucous membranes are normal.  Eyes:     Extraocular Movements: EOM normal.     Conjunctiva/sclera: Conjunctivae normal.     Pupils: Pupils are equal, round, and reactive to light.  Cardiovascular:     Rate and Rhythm: Regular rhythm.     Heart sounds: S1 normal and S2 normal. No murmur heard. No friction rub. No gallop.   Pulmonary:     Effort: Pulmonary effort is normal. No respiratory distress.     Breath sounds: Normal breath sounds.  Chest:     Chest wall: No tenderness.  Abdominal:     General: Bowel sounds are normal.     Palpations: Abdomen is soft. There is no hepatosplenomegaly.     Tenderness:  There is no abdominal tenderness. There is no guarding or rebound. Negative signs include Murphy's sign and McBurney's sign.     Hernia: No hernia is present.  Musculoskeletal:        General: Normal range of motion.     Cervical back: Normal range of motion and neck supple.  Skin:    General: Skin is warm, dry and intact.     Findings: No rash.     Nails: There is no cyanosis.  Neurological:     Mental Status: She is alert and oriented to person, place, and time.     GCS: GCS eye subscore is 4. GCS verbal subscore is 5. GCS motor subscore is 6.     Cranial Nerves: No cranial nerve deficit.     Sensory: No sensory deficit.     Coordination: Coordination normal.     Deep Tendon Reflexes: Strength normal.  Psychiatric:        Mood and Affect: Mood and affect normal.        Speech: Speech normal.        Behavior: Behavior normal.        Thought Content: Thought content normal.     ED Results / Procedures / Treatments   Labs (all labs ordered are listed, but only abnormal results are displayed) Labs Reviewed  BASIC METABOLIC PANEL - Abnormal; Notable for the following components:      Result Value   Potassium 3.3 (*)    Glucose, Bld 169 (*)    All other components within normal limits  CBC - Abnormal; Notable for the following components:   Hemoglobin 11.3 (*)    HCT 34.3 (*)    All other components within normal limits  SARS CORONAVIRUS 2 BY RT PCR (HOSPITAL ORDER, [place] [facility])  TROPONIN I (HIGH SENSITIVITY)  TROPONIN I (HIGH SENSITIVITY)    EKG EKG Interpretation  Date/Time:  Thursday April 05 2020 23:16:45 EST Ventricular Rate:  85 PR Interval:  164 QRS Duration: 76 QT Interval:  352 QTC Calculation: 418 R Axis:   28 Text Interpretation: Normal sinus rhythm Normal ECG Confirmed by Orpah Greek [id number]) on 04/06/2020 4:24:55 AM   Radiology No results found.  Procedures Procedures (including critical care time)  Medications  Ordered  in ED Medications - No data to display  ED Course  I have reviewed the triage vital signs and the nursing notes.  Pertinent labs & imaging results that were available during my care of the patient were reviewed by me and considered in my medical decision making (see chart for details).    MDM Rules/Calculators/A&P                          Patient presents to the emergency department for evaluation of chest discomfort and tachycardia.  Patient does not have any known cardiac disease and does not have significant cardiac risk factors.  She does, however, reports that she has been experiencing intermittent exertional chest pain over the last year or so.  She had pain at rest tonight which she has not had previously.  Although both of her troponins are negative, her delta is elevated and recommendation is for hospitalization.  Discussed with Dr. Doylene Canard, her cardiologist.  We will perform a third troponin and he will see her this morning in the ED to determine treatment and disposition.  Final Clinical Impression(s) / ED Diagnoses Final diagnoses:  Chest pain, unspecified type    Rx / DC Orders ED Discharge Orders    None       Gunner Iodice, Gwenyth Allegra, MD 04/06/20 [phone]

## 2020-04-06 NOTE — ED Notes (Signed)
P 

## 2020-04-06 NOTE — H&P (Signed)
Referring Physician: Frederich Cha, MD  Holly Gray is an 45 y.o. female.                       Chief Complaint: chest pain  HPI: 45 years old female had dull, left sided chest pain without radiation but with weired feeling of heart beating. No nausea or sweating spell. She is chest pain free now. EKG shows NSR. Chest x-ray is unremarkable. HS-Troponin I is minimally elevated from 1st Troponin I level.  Her medications include aspirin and iron pill.  Past Medical History:  Diagnosis Date   Anemia    Anxiety    Brain aneurysm    Bronchitis    hx of   Cerebral aneurysm, nonruptured    PT D/C on 01-16-15   Stroke [facility])    ?    Varicose veins       Past Surgical History:  Procedure Laterality Date   CEREBRAL ANEURYSM REPAIR Left    01-16-15   CESAREAN SECTION     CYST EXCISION     groin left   RADIOLOGY WITH ANESTHESIA N/A 10/30/2014   Procedure: RADIOLOGY WITH ANESTHESIA;  Surgeon: Luanne Bras, MD;  Location: [place];  Service: Radiology;  Laterality: N/A;   RADIOLOGY WITH ANESTHESIA N/A 01/15/2015   Procedure: ANEURYSM EMBOLIZATION;  Surgeon: Luanne Bras, MD;  Location: [place];  Service: Radiology;  Laterality: N/A;    Family History  Problem Relation Age of Onset   Hypertension Mother    Social History:  reports that she has never smoked. She has never used smokeless tobacco. She reports that she does not drink alcohol and does not use drugs.  Allergies:  Allergies  Allergen Reactions   Mango Flavor Itching    (Not in a hospital admission)   Results for orders placed or performed during the hospital encounter of 04/05/20 (from the past 48 hour(s))  Basic metabolic panel     Status: Abnormal   Collection Time: 04/05/20 11:25 PM  Result Value Ref Range   Sodium 139 135 - 145 mmol/L   Potassium 3.3 (L) 3.5 - 5.1 mmol/L   Chloride 106 98 - 111 mmol/L   CO2 24 22 - 32 mmol/L   Glucose, Bld 169 (H) 70 - 99 mg/dL    Comment: Glucose reference  range applies only to samples taken after fasting for at least 8 hours.   BUN 14 6 - 20 mg/dL   Creatinine, Ser 0.70 0.44 - 1.00 mg/dL   Calcium 9.1 8.9 - 10.3 mg/dL   GFR, Estimated >60 >60 mL/min    Comment: (NOTE) Calculated using the CKD-EPI Creatinine Equation (2021)    Anion gap 9 5 - 15    Comment: Performed at [facility] [address] [place], [place] [address]  CBC     Status: Abnormal   Collection Time: 04/05/20 11:25 PM  Result Value Ref Range   WBC 4.7 4.0 - 10.5 K/uL   RBC 3.88 3.87 - 5.11 MIL/uL   Hemoglobin 11.3 (L) 12.0 - 15.0 g/dL   HCT 34.3 (L) 36.0 - 46.0 %   MCV 88.4 80.0 - 100.0 fL   MCH 29.1 26.0 - 34.0 pg   MCHC 32.9 30.0 - 36.0 g/dL   RDW 14.1 11.5 - 15.5 %   Platelets 214 150 - 400 K/uL   nRBC 0.0 0.0 - 0.2 %    Comment: Performed at [facility], [place] [address],  [place], [place] [address]  Troponin I (High Sensitivity)     Status: None   Collection Time: 04/05/20 11:25 PM  Result Value Ref Range   Troponin I (High Sensitivity) 4 <18 ng/L    Comment: (NOTE) Elevated high sensitivity troponin I (hsTnI) values and significant  changes across serial measurements may suggest ACS but many other  chronic and acute conditions are known to elevate hsTnI results.  Refer to the "Links" section for chest pain algorithms and additional  guidance. Performed at [facility], [place] [address], [place], [place] [address]   Troponin I (High Sensitivity)     Status: None   Collection Time: 04/06/20  1:38 AM  Result Value Ref Range   Troponin I (High Sensitivity) 10 <18 ng/L    Comment: (NOTE) Elevated high sensitivity troponin I (hsTnI) values and significant  changes across serial measurements may suggest ACS but many other  chronic and acute conditions are known to elevate hsTnI results.  Refer to the "Links" section for chest pain algorithms and additional  guidance. Performed at [facility], [place] [address] [place],  [place] [address]   SARS Coronavirus 2 by RT PCR (hospital order, performed in [facility] hospital lab) Nasopharyngeal Nasopharyngeal Swab     Status: None   Collection Time: 04/06/20  5:12 AM   Specimen: Nasopharyngeal Swab  Result Value Ref Range   SARS Coronavirus 2 NEGATIVE NEGATIVE    Comment: (NOTE) SARS-CoV-2 target nucleic acids are NOT DETECTED.  The SARS-CoV-2 RNA is generally detectable in upper and lower respiratory specimens during the acute phase of infection. The lowest concentration of SARS-CoV-2 viral copies this assay can detect is 250 copies / mL. A negative result does not preclude SARS-CoV-2 infection and should not be used as the sole basis for treatment or other patient management decisions.  A negative result may occur with improper specimen collection / handling, submission of specimen other than nasopharyngeal swab, presence of viral mutation(s) within the areas targeted by this assay, and inadequate number of viral copies (<250 copies / mL). A negative result must be combined with clinical observations, patient history, and epidemiological information.  Fact Sheet for Patients:   StrictlyIdeas.no  Fact Sheet for Healthcare Providers: BankingDealers.co.za  This test is not yet approved or  cleared by the [place] FDA and has been authorized for detection and/or diagnosis of SARS-CoV-2 by FDA under an Emergency Use Authorization (EUA).  This EUA will remain in effect (meaning this test can be used) for the duration of the COVID-19 declaration under Section 564(b)(1) of the Act, 21 U.S.C. section 360bbb-3(b)(1), unless the authorization is terminated or revoked sooner.  Performed at [facility], [place] [address] [place], [place] [address]    DG Chest Port 1 View  Result Date: 04/06/2020 CLINICAL DATA:  Chest pain EXAM: PORTABLE CHEST 1 VIEW COMPARISON:  12/24/2013 FINDINGS: The heart size and  mediastinal contours are within normal limits. Both lungs are clear. The visualized skeletal structures are unremarkable. IMPRESSION: No active disease. Electronically Signed   By: Constance Holster M.D.   On: 04/06/2020 05:41    Review Of Systems Constitutional: No fever, chills, weight loss or gain. Eyes: No vision change, wears glasses. No discharge or pain. Ears: No hearing loss, No tinnitus. Respiratory: No asthma, COPD, pneumonias. No shortness of breath. No hemoptysis. Cardiovascular: Positive chest pain, palpitation, no leg edema. Gastrointestinal: No nausea, vomiting, diarrhea, constipation. No GI bleed. No hepatitis. Genitourinary: No dysuria, hematuria,  kidney stone. No incontinance. Neurological: No headache, stroke, seizures.  Psychiatry: No psych facility admission for anxiety, depression, suicide. No detox. Skin: No rash. Musculoskeletal: No joint pain, fibromyalgia. No neck pain, back pain. Lymphadenopathy: No lymphadenopathy. Hematology: No anemia or easy bruising.   Blood pressure 126/74, pulse 68, temperature 97.6 F (36.4 C), temperature source Oral, resp. rate 13, SpO2 100 %. There is no height or weight on file to calculate BMI. General appearance: alert, cooperative, appears stated age and no distress Head: Normocephalic, atraumatic. Eyes: Brown/Blue/Hazel eyes, pink conjunctiva, corneas clear. . Neck: No adenopathy, no carotid bruit, no JVD, supple, symmetrical, trachea midline and thyroid not enlarged. Resp: Clear to auscultation bilaterally. Cardio: Regular rate and rhythm, S1, S2 normal, II/VI systolic murmur, no click, rub or gallop GI: Soft, non-tender; bowel sounds normal; no organomegaly. Extremities: No edema, cyanosis or clubbing. Skin: Warm and dry.  Neurologic: Alert and oriented X 3, normal strength.  Assessment/Plan Acute coronary syndrome Palpitation H/O iron deficiency anemia H/O brain aneurysm repair  Discussed nuclear stress test v/s  cardiac cath. Awaiting 3rd Troponin I.  Time spent: Review of old records, Lab, x-rays, EKG, other cardiac tests, examination, discussion with patient/Nurse/ER physician over 70 minutes.  Birdie Riddle, MD  04/06/2020, 7:18 AM

## 2020-04-06 NOTE — ED Notes (Signed)
Per admitting MD, Pt will go for a stress test. Orders currently being placed by admitting MD. Will continue to monitor.

## 2020-04-06 NOTE — ED Notes (Signed)
Pt transported to Nuclear Med for Cardiac Stress Test.

## 2020-04-06 NOTE — Progress Notes (Signed)
ANTICOAGULATION CONSULT NOTE - Initial Consult  Pharmacy Consult for heparin Indication: chest pain/ACS  Allergies  Allergen Reactions  . Mango Flavor Itching    Patient Measurements: Actual body weight: 83 kg (on 08/25/19) Ideal body weight: 57 kg  Heparin Dosing Weight: 75 kg   Vital Signs: Temp: 97.8 F (36.6 C) (01/21 0731) Temp Source: Oral (01/21 0731) BP: 100/72 (01/21 0900) Pulse Rate: 97 (01/21 0900)  Labs: Recent Labs    04/05/20 2325 04/06/20 0138 04/06/20 0630  HGB 11.3*  --   --   HCT 34.3*  --   --   PLT 214  --   --   CREATININE 0.70  --   --   TROPONINIHS 4 10 7     CrCl cannot be calculated (Unknown ideal weight.).   Medical History: Past Medical History:  Diagnosis Date  . Anemia   . Anxiety   . Brain aneurysm   . Bronchitis    hx of  . Cerebral aneurysm, nonruptured    PT D/C on 01-16-15  . Stroke [facility])    ?   . Varicose veins     Medications:  (Not in a hospital admission)   Assessment: 89 YOF with chest pain to start IV heparin for ACS. H/H low, Plt wnl. SCr wnl  Goal of Therapy:  Heparin level 0.3-0.7 units/ml Monitor platelets by anticoagulation protocol: Yes   Plan:  -Heparin 4000 units IV bolus followed by heparin infusion at 950 units/hr -F/u 6 hr HL -Monitor daily HL, CBC and s/s of bleeding  Albertina Parr, PharmD., BCPS, BCCCP Clinical Pharmacist Please refer to [facility] for unit-specific pharmacist

## 2020-04-06 NOTE — Progress Notes (Signed)
°  Echocardiogram 2D Echocardiogram has been performed.  Holly Gray 04/06/2020, 4:05 PM

## 2020-04-06 NOTE — ED Notes (Signed)
Attempted report x1. 

## 2020-04-06 NOTE — ED Notes (Signed)
Called Dr Birdie Riddle to Dr Betsey Holiday

## 2020-04-06 NOTE — Progress Notes (Signed)
No reversible ischemia on NM myocardial perfusion stress test. Echocardiogram with normal LV systolic function.  Plan: Continue Low dose metoprolol and titrate upwards as needed. Check Lipid panel tomorrow. Increase activity as tolerated. Patient had lot of questions about heart, tachycardia and stress test and all were answered.  Dixie Dials, MD  04/06/2020 5:42 PM

## 2020-04-07 LAB — GLUCOSE, CAPILLARY: Glucose-Capillary: 113 mg/dL — ABNORMAL HIGH (ref 70–99)

## 2020-04-07 LAB — BASIC METABOLIC PANEL
Anion gap: 10 (ref 5–15)
BUN: 12 mg/dL (ref 6–20)
CO2: 21 mmol/L — ABNORMAL LOW (ref 22–32)
Calcium: 9.4 mg/dL (ref 8.9–10.3)
Chloride: 106 mmol/L (ref 98–111)
Creatinine, Ser: 0.62 mg/dL (ref 0.44–1.00)
GFR, Estimated: >60 mL/min (ref >60)
Glucose, Bld: 94 mg/dL (ref 70–99)
Potassium: 4 mmol/L (ref 3.5–5.1)
Sodium: 137 mmol/L (ref 135–145)

## 2020-04-07 LAB — CBC
HCT: 36.4 % (ref 36.0–46.0)
Hemoglobin: 12.2 g/dL (ref 12.0–15.0)
MCH: 29.5 pg (ref 26.0–34.0)
MCHC: 33.5 g/dL (ref 30.0–36.0)
MCV: 87.9 fL (ref 80.0–100.0)
Platelets: 213 10*3/uL (ref 150–400)
RBC: 4.14 MIL/uL (ref 3.87–5.11)
RDW: 13.9 % (ref 11.5–15.5)
WBC: 4.5 10*3/uL (ref 4.0–10.5)
nRBC: 0 % (ref 0.0–0.2)

## 2020-04-07 LAB — LIPID PANEL
Cholesterol: 180 mg/dL (ref 0–200)
HDL: 61 mg/dL (ref >40)
LDL Cholesterol: 105 mg/dL — ABNORMAL HIGH (ref 0–99)
Total CHOL/HDL Ratio: 3 RATIO
Triglycerides: 70 mg/dL (ref <150)
VLDL: 14 mg/dL (ref 0–40)

## 2020-04-07 LAB — TSH: TSH: 0.423 u[IU]/mL (ref 0.350–4.500)

## 2020-04-07 MED ORDER — POTASSIUM CHLORIDE CRYS ER 10 MEQ PO TBCR: 10.0000 meq | EXTENDED_RELEASE_TABLET | ORAL | 3 refills | Status: AC

## 2020-04-07 MED ORDER — METOPROLOL TARTRATE 25 MG PO TABS: 12.5000 mg | ORAL_TABLET | Freq: Two times a day (BID) | ORAL | 3 refills | Status: AC

## 2020-04-07 MED ORDER — NITROGLYCERIN 0.4 MG SL SUBL: 0.4000 mg | SUBLINGUAL_TABLET | SUBLINGUAL | 0 refills | Status: AC | PRN

## 2020-04-07 MED ORDER — ATORVASTATIN CALCIUM 40 MG PO TABS: 40.0000 mg | ORAL_TABLET | Freq: Every day | ORAL | 3 refills | Status: AC

## 2020-04-07 NOTE — Discharge Instructions (Signed)

## 2020-04-07 NOTE — Discharge Summary (Signed)
Physician Discharge Summary  Patient ID: Holly Gray MRN: [id number] DOB/AGE: 1975/04/27 45 y.o.  Admit date: 04/05/2020 Discharge date: 04/07/2020  Admission Diagnoses: Acute coronary syndrome Palpitation H/O iron deficiency anemia H/O brain aneurysm with repair  Discharge Diagnoses:  Principle Problem: Chest pain Active Problems:   Palpitation   Iron deficiency anemia   H/O periophthalmic aneurysm with embolization   Hyperlipidemia  Discharged Condition: good  Hospital Course: 45 years old female presented with left sided chest pain. Her EKG, CXR, Echocardiogram and Blood work were unremarkable. She underwent NM myocardial perfusion stress test with no reversible ischemia. She was started on Atorvastatin for elevated LDL level and low dose metoprolol tartrate for palpitation and borderline LVH. She will continue Aspirin and iron pill.  She will see Dr. Estanislado Pandy for brain aneurysm as needed and see me in 1 week.  Consults: cardiology  Significant Diagnostic Studies: labs: Mild anemia with Hgb near 11 gm. Normal troponin I, EKG, CXR, echocardiogram and NM myocardial perfusion stress test.  Treatments: cardiac meds: metoprolol, aspirin and atorvastatin.  Discharge Exam: Blood pressure 123/77, pulse 78, temperature 98 F (36.7 C), temperature source Oral, resp. rate 16, height 5\' 5"  (1.651 m), weight 87.8 kg, SpO2 100 %. General appearance: alert, cooperative and appears stated age. Head: Normocephalic, atraumatic. Eyes: Brown eyes, pink conjunctiva, corneas clear.   Neck: No adenopathy, no carotid bruit, no JVD, supple, symmetrical, trachea midline and thyroid not enlarged. Resp: Clear to auscultation bilaterally. Cardio: Regular rate and rhythm, S1, S2 normal, II/VI systolic murmur, no click, rub or gallop. GI: Soft, non-tender; bowel sounds normal; no organomegaly. Extremities: No edema, cyanosis or clubbing. Skin: Warm and dry.  Neurologic: Alert and oriented X 3,  normal strength and tone. Normal coordination and gait.  Disposition: Discharge disposition: 01-Home or Self Care        Allergies as of 04/07/2020      Reactions   Mango Flavor Itching      Medication List    TAKE these medications   aspirin 325 MG tablet Take 325 mg by mouth at bedtime.   atorvastatin 40 MG tablet Commonly known as: LIPITOR Take 1 tablet (40 mg total) by mouth daily.   ferrous sulfate 325 (65 FE) MG tablet Take 1 tablet (325 mg total) by mouth daily.   metoprolol tartrate 25 MG tablet Commonly known as: LOPRESSOR Take 0.5 tablets (12.5 mg total) by mouth 2 (two) times daily.   nitroGLYCERIN 0.4 MG SL tablet Commonly known as: NITROSTAT Place 1 tablet (0.4 mg total) under the tongue every 5 (five) minutes x 3 doses as needed for chest pain.   potassium chloride 10 MEQ tablet Commonly known as: KLOR-CON Take 1 tablet (10 mEq total) by mouth every Monday, Wednesday, and Friday. Start taking on: April 09, 2020   SYSTANE OP Place 1 drop into both eyes 2 (two) times daily as needed (dry eyes).       Follow-up Information    Dixie Dials, MD. Schedule an appointment as soon as possible for a visit in 1 week(s).   Specialty: Cardiology Contact information: [place] [address] [phone]               Time spent: Review of old chart, current chart, lab, x-ray, cardiac tests and discussion with patient over 60 minutes.  Signed: Birdie Riddle 04/07/2020, 8:46 AM

## 2021-02-25 ENCOUNTER — Ambulatory Visit: Payer: Self-pay

## 2022-11-11 ENCOUNTER — Ambulatory Visit: Payer: Self-pay | Admitting: Internal Medicine

## 2023-03-25 ENCOUNTER — Ambulatory Visit: Payer: Self-pay | Admitting: Internal Medicine

## 2023-09-12 ENCOUNTER — Emergency Department (HOSPITAL_BASED_OUTPATIENT_CLINIC_OR_DEPARTMENT_OTHER)
Admission: EM | Admit: 2023-09-12 | Discharge: 2023-09-12 | Disposition: A | Discharge status: Home / Self Care | Payer: Self-pay | Attending: Emergency Medicine | Admitting: Emergency Medicine

## 2023-09-12 ENCOUNTER — Encounter (HOSPITAL_BASED_OUTPATIENT_CLINIC_OR_DEPARTMENT_OTHER): Payer: Self-pay | Admitting: Emergency Medicine

## 2023-09-12 ENCOUNTER — Emergency Department (HOSPITAL_BASED_OUTPATIENT_CLINIC_OR_DEPARTMENT_OTHER): Payer: Self-pay

## 2023-09-12 ENCOUNTER — Other Ambulatory Visit: Payer: Self-pay

## 2023-09-12 DIAGNOSIS — R0789 Other chest pain: Secondary | ICD-10-CM | POA: Insufficient documentation

## 2023-09-12 DIAGNOSIS — Z79899 Other long term (current) drug therapy: Secondary | ICD-10-CM | POA: Insufficient documentation

## 2023-09-12 DIAGNOSIS — R079 Chest pain, unspecified: Secondary | ICD-10-CM

## 2023-09-12 DIAGNOSIS — F439 Reaction to severe stress, unspecified: Secondary | ICD-10-CM | POA: Insufficient documentation

## 2023-09-12 DIAGNOSIS — Z7982 Long term (current) use of aspirin: Secondary | ICD-10-CM | POA: Insufficient documentation

## 2023-09-12 DIAGNOSIS — R0602 Shortness of breath: Secondary | ICD-10-CM | POA: Insufficient documentation

## 2023-09-12 LAB — URINALYSIS, ROUTINE W REFLEX MICROSCOPIC
Bacteria, UA: NONE SEEN
Bilirubin Urine: NEGATIVE
Glucose, UA: NEGATIVE mg/dL
Hgb urine dipstick: NEGATIVE
Ketones, ur: NEGATIVE mg/dL
Nitrite: NEGATIVE
Protein, ur: NEGATIVE mg/dL
Specific Gravity, Urine: <1.005 — ABNORMAL LOW (ref 1.005–1.030)
pH: 6.5 (ref 5.0–8.0)

## 2023-09-12 LAB — COMPREHENSIVE METABOLIC PANEL WITH GFR
ALT: 12 U/L (ref 0–44)
AST: 17 U/L (ref 15–41)
Albumin: 4.1 g/dL (ref 3.5–5.0)
Alkaline Phosphatase: 80 U/L (ref 38–126)
Anion gap: 10 (ref 5–15)
BUN: 13 mg/dL (ref 6–20)
CO2: 24 mmol/L (ref 22–32)
Calcium: 9.3 mg/dL (ref 8.9–10.3)
Chloride: 107 mmol/L (ref 98–111)
Creatinine, Ser: 0.95 mg/dL (ref 0.44–1.00)
GFR, Estimated: >60 mL/min (ref >60)
Glucose, Bld: 113 mg/dL — ABNORMAL HIGH (ref 70–99)
Potassium: 3.4 mmol/L — ABNORMAL LOW (ref 3.5–5.1)
Sodium: 141 mmol/L (ref 135–145)
Total Bilirubin: <0.2 mg/dL (ref 0.0–1.2)
Total Protein: 7.3 g/dL (ref 6.5–8.1)

## 2023-09-12 LAB — CBC WITH DIFFERENTIAL/PLATELET
Abs Immature Granulocytes: 0.01 10*3/uL (ref 0.00–0.07)
Basophils Absolute: 0 10*3/uL (ref 0.0–0.1)
Basophils Relative: 1 %
Eosinophils Absolute: 0.1 10*3/uL (ref 0.0–0.5)
Eosinophils Relative: 3 %
HCT: 34.2 % — ABNORMAL LOW (ref 36.0–46.0)
Hemoglobin: 11.5 g/dL — ABNORMAL LOW (ref 12.0–15.0)
Immature Granulocytes: 0 %
Lymphocytes Relative: 43 %
Lymphs Abs: 2.4 10*3/uL (ref 0.7–4.0)
MCH: 29.1 pg (ref 26.0–34.0)
MCHC: 33.6 g/dL (ref 30.0–36.0)
MCV: 86.6 fL (ref 80.0–100.0)
Monocytes Absolute: 0.5 10*3/uL (ref 0.1–1.0)
Monocytes Relative: 9 %
Neutro Abs: 2.6 10*3/uL (ref 1.7–7.7)
Neutrophils Relative %: 44 %
Platelets: 213 10*3/uL (ref 150–400)
RBC: 3.95 MIL/uL (ref 3.87–5.11)
RDW: 13.2 % (ref 11.5–15.5)
WBC: 5.7 10*3/uL (ref 4.0–10.5)
nRBC: 0 % (ref 0.0–0.2)

## 2023-09-12 LAB — TROPONIN T, HIGH SENSITIVITY
Troponin T High Sensitivity: <15 ng/L (ref <19)
Troponin T High Sensitivity: <15 ng/L (ref <19)

## 2023-09-12 LAB — D-DIMER, QUANTITATIVE: D-Dimer, Quant: 0.28 ug{FEU}/mL (ref 0.00–0.50)

## 2023-09-12 NOTE — ED Provider Notes (Signed)
 [place] EMERGENCY DEPARTMENT AT [facility] Provider Note   CSN: [id number] Arrival date & time: 09/12/23  0004     Patient presents with: Shortness of Breath   Holly Gray is a 48 y.o. female.  {Add pertinent medical, surgical, social history, OB history to YEP:67052} Patient presents to the emergency department for evaluation of chest heaviness and shortness of breath.  Patient reports that the symptoms have been occurring intermittently.  She reports that the heaviness seems to be in the low central chest area.  Tonight, however, she had some radiation to her back.  She does report that she is under a lot of stress currently.        Prior to Admission medications   Medication Sig Start Date End Date Taking? Authorizing Provider  aspirin  325 MG tablet Take 325 mg by mouth at bedtime.    [provider]  atorvastatin  (LIPITOR) 40 MG tablet Take 1 tablet (40 mg total) by mouth daily. 04/07/20   Claudene Pacific, MD  ferrous sulfate  325 (65 FE) MG tablet Take 1 tablet (325 mg total) by mouth daily. 08/25/19   Layden, Lindsey A, PA-C  metoprolol  tartrate (LOPRESSOR ) 25 MG tablet Take 0.5 tablets (12.5 mg total) by mouth 2 (two) times daily. 04/07/20   Claudene Pacific, MD  nitroGLYCERIN  (NITROSTAT ) 0.4 MG SL tablet Place 1 tablet (0.4 mg total) under the tongue every 5 (five) minutes x 3 doses as needed for chest pain. 04/07/20   Claudene Pacific, MD  Polyethyl Glycol-Propyl Glycol (SYSTANE OP) Place 1 drop into both eyes 2 (two) times daily as needed (dry eyes).    [provider]  potassium chloride  (KLOR-CON ) 10 MEQ tablet Take 1 tablet (10 mEq total) by mouth every Monday, Wednesday, and Friday. 04/09/20   Claudene Pacific, MD    Allergies: Mango flavoring agent (non-screening)    Review of Systems  Updated Vital Signs BP 138/88 (BP Location: Right Arm)   Pulse 86   Temp 98 F (36.7 C) (Oral)   Resp 19   Ht 5' 5 (1.651 m)   Wt 86.2 kg   SpO2 99%   BMI  31.62 kg/m   Physical Exam Vitals and nursing note reviewed.  Constitutional:      General: She is not in acute distress.    Appearance: She is well-developed.  HENT:     Head: Normocephalic and atraumatic.     Mouth/Throat:     Mouth: Mucous membranes are moist.   Eyes:     General: Vision grossly intact. Gaze aligned appropriately.     Extraocular Movements: Extraocular movements intact.     Conjunctiva/sclera: Conjunctivae normal.    Cardiovascular:     Rate and Rhythm: Normal rate and regular rhythm.     Pulses: Normal pulses.     Heart sounds: Normal heart sounds, S1 normal and S2 normal. No murmur heard.    No friction rub. No gallop.  Pulmonary:     Effort: Pulmonary effort is normal. No respiratory distress.     Breath sounds: Normal breath sounds.  Abdominal:     General: Bowel sounds are normal.     Palpations: Abdomen is soft.     Tenderness: There is no abdominal tenderness. There is no guarding or rebound.     Hernia: No hernia is present.   Musculoskeletal:        General: No swelling.     Cervical back: Full passive range of motion without pain, normal range of  motion and neck supple. No spinous process tenderness or muscular tenderness. Normal range of motion.     Right lower leg: No edema.     Left lower leg: No edema.   Skin:    General: Skin is warm and dry.     Capillary Refill: Capillary refill takes less than 2 seconds.     Findings: No ecchymosis, erythema, rash or wound.   Neurological:     General: No focal deficit present.     Mental Status: She is alert and oriented to person, place, and time.     GCS: GCS eye subscore is 4. GCS verbal subscore is 5. GCS motor subscore is 6.     Cranial Nerves: Cranial nerves 2-12 are intact.     Sensory: Sensation is intact.     Motor: Motor function is intact.     Coordination: Coordination is intact.   Psychiatric:        Attention and Perception: Attention normal.        Mood and Affect: Mood  normal.        Speech: Speech normal.        Behavior: Behavior normal.     (all labs ordered are listed, but only abnormal results are displayed) Labs Reviewed  CBC WITH DIFFERENTIAL/PLATELET - Abnormal; Notable for the following components:      Result Value   Hemoglobin 11.5 (*)    HCT 34.2 (*)    All other components within normal limits  COMPREHENSIVE METABOLIC PANEL WITH GFR - Abnormal; Notable for the following components:   Potassium 3.4 (*)    Glucose, Bld 113 (*)    All other components within normal limits  URINALYSIS, ROUTINE W REFLEX MICROSCOPIC - Abnormal; Notable for the following components:   Color, Urine COLORLESS (*)    Specific Gravity, Urine <1.005 (*)    Leukocytes,Ua TRACE (*)    All other components within normal limits  D-DIMER, QUANTITATIVE  TROPONIN T, HIGH SENSITIVITY  TROPONIN T, HIGH SENSITIVITY    EKG: None  Radiology: DG Chest 1 View Result Date: 09/12/2023 CLINICAL DATA:  Shortness of breath and chest discomfort EXAM: CHEST  1 VIEW COMPARISON:  Radiograph 04/06/2020 FINDINGS: The heart size and mediastinal contours are within normal limits. Both lungs are clear. The visualized skeletal structures are unremarkable. IMPRESSION: No active disease. Electronically Signed   By: Norman Gatlin M.D.   On: 09/12/2023 03:44    {Document cardiac monitor, telemetry assessment procedure when appropriate:32947} Procedures   Medications Ordered in the ED - No data to display    {Click here for ABCD2, HEART and other calculators REFRESH Note before signing:1}                              Medical Decision Making Amount and/or Complexity of Data Reviewed Labs: ordered. Radiology: ordered.   Differential Diagnosis considered includes, but not limited to: STEMI; NSTEMI; myocarditis; pericarditis; pulmonary embolism; aortic dissection; pneumothorax; pneumonia; gastritis; musculoskeletal pain  Patient presents to the emergency department for evaluation  of chest pain and shortness of breath.  She has been having intermittent episodes of same.  Patient was hospitalized in 2022 for tachycardia and chest pain, had a normal stress test.  Patient's workup was reassuring.  Troponin negative.  EKG without ischemic changes.  D-dimer negative.  Patient is comfortable currently, symptom-free.  Patient felt to be low risk for acute coronary syndrome, will be appropriate for discharge.  She can follow-up with her cardiologist, Dr. Claudene.  {Document critical care time when appropriate  Document review of labs and clinical decision tools ie CHADS2VASC2, etc  Document your independent review of radiology images and any outside records  Document your discussion with family members, caretakers and with consultants  Document social determinants of health affecting pt's care  Document your decision making why or why not admission, treatments were needed:32947:::1}   Final diagnoses:  Chest pain, unspecified type    ED Discharge Orders     None

## 2023-09-12 NOTE — ED Triage Notes (Signed)
 Pt coming from home with SOB and chest discomfort that started around 2315 last night. Pt states the chest pain and discomfort that radiates to the back that came after the SOB. Pt states this occurred earlier in the day as well.

## 2023-09-12 NOTE — ED Notes (Signed)
Lt green sent to lab

## 2023-09-12 NOTE — ED Notes (Signed)
 Lab says prev trop hemolyzed, sent another tube to lab.

## 2023-10-01 ENCOUNTER — Other Ambulatory Visit: Payer: Self-pay

## 2023-10-01 ENCOUNTER — Emergency Department (HOSPITAL_COMMUNITY)
Admission: EM | Admit: 2023-10-01 | Discharge: 2023-10-01 | Discharge status: Against Medical Advice | Payer: Self-pay | Attending: Emergency Medicine | Admitting: Emergency Medicine

## 2023-10-01 ENCOUNTER — Encounter (HOSPITAL_COMMUNITY): Payer: Self-pay

## 2023-10-01 ENCOUNTER — Emergency Department (HOSPITAL_COMMUNITY): Payer: Self-pay

## 2023-10-01 DIAGNOSIS — Y92410 Unspecified street and highway as the place of occurrence of the external cause: Secondary | ICD-10-CM | POA: Diagnosis not present

## 2023-10-01 DIAGNOSIS — Z5321 Procedure and treatment not carried out due to patient leaving prior to being seen by health care provider: Secondary | ICD-10-CM | POA: Insufficient documentation

## 2023-10-01 DIAGNOSIS — R519 Headache, unspecified: Secondary | ICD-10-CM | POA: Insufficient documentation

## 2023-10-01 MED ORDER — ACETAMINOPHEN 500 MG PO TABS
1000.0000 mg | ORAL_TABLET | Freq: Once | ORAL | Status: AC
  Administered 2023-10-01: 1000 mg via ORAL
  Filled 2023-10-01: qty 2

## 2023-10-01 NOTE — ED Notes (Signed)
 Patient left.
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
